# Patient Record
Sex: Female | Born: 1955 | Race: Black or African American | Hispanic: No | Smoking: Never smoker
Health system: Southern US, Community
[De-identification: ages and names within clinical notes are randomized; demographics above are authoritative.]

## PROBLEM LIST (undated history)

## (undated) DIAGNOSIS — Z973 Presence of spectacles and contact lenses: Secondary | ICD-10-CM

## (undated) DIAGNOSIS — I2510 Atherosclerotic heart disease of native coronary artery without angina pectoris: Secondary | ICD-10-CM

## (undated) DIAGNOSIS — M1990 Unspecified osteoarthritis, unspecified site: Secondary | ICD-10-CM

---

## 2005-05-13 HISTORY — PX: BREAST SURGERY: SHX581

## 2006-05-13 HISTORY — PX: ANKLE FRACTURE SURGERY: SHX122

## 2006-05-13 HISTORY — PX: KNEE ARTHROSCOPY: SHX127

## 2007-10-16 ENCOUNTER — Emergency Department (HOSPITAL_COMMUNITY): Admission: EM | Admit: 2007-10-16 | Discharge: 2007-10-16 | Payer: Self-pay | Admitting: Emergency Medicine

## 2008-05-17 ENCOUNTER — Ambulatory Visit: Payer: Self-pay | Admitting: Internal Medicine

## 2008-05-17 DIAGNOSIS — M7730 Calcaneal spur, unspecified foot: Secondary | ICD-10-CM | POA: Insufficient documentation

## 2008-05-17 DIAGNOSIS — E669 Obesity, unspecified: Secondary | ICD-10-CM | POA: Insufficient documentation

## 2008-05-19 ENCOUNTER — Ambulatory Visit: Payer: Self-pay | Admitting: Internal Medicine

## 2008-06-10 ENCOUNTER — Emergency Department (HOSPITAL_COMMUNITY): Admission: EM | Admit: 2008-06-10 | Discharge: 2008-06-10 | Payer: Self-pay | Admitting: Family Medicine

## 2008-06-10 ENCOUNTER — Telehealth (INDEPENDENT_AMBULATORY_CARE_PROVIDER_SITE_OTHER): Payer: Self-pay | Admitting: Internal Medicine

## 2008-06-15 ENCOUNTER — Encounter (INDEPENDENT_AMBULATORY_CARE_PROVIDER_SITE_OTHER): Payer: Self-pay | Admitting: Internal Medicine

## 2008-07-15 ENCOUNTER — Encounter (INDEPENDENT_AMBULATORY_CARE_PROVIDER_SITE_OTHER): Payer: Self-pay | Admitting: Internal Medicine

## 2008-07-15 ENCOUNTER — Ambulatory Visit: Payer: Self-pay | Admitting: Internal Medicine

## 2008-07-15 DIAGNOSIS — M1710 Unilateral primary osteoarthritis, unspecified knee: Secondary | ICD-10-CM

## 2008-07-15 DIAGNOSIS — IMO0002 Reserved for concepts with insufficient information to code with codable children: Secondary | ICD-10-CM | POA: Insufficient documentation

## 2008-07-15 LAB — CONVERTED CEMR LAB
Chlamydia, DNA Probe: NEGATIVE
Ketones, urine, test strip: NEGATIVE
Nitrite: NEGATIVE
Specific Gravity, Urine: >=1.03
Urobilinogen, UA: 0.2
WBC Urine, dipstick: NEGATIVE
pH: 5

## 2008-07-19 ENCOUNTER — Encounter (INDEPENDENT_AMBULATORY_CARE_PROVIDER_SITE_OTHER): Payer: Self-pay | Admitting: Internal Medicine

## 2008-07-19 ENCOUNTER — Ambulatory Visit (HOSPITAL_COMMUNITY): Admission: RE | Admit: 2008-07-19 | Discharge: 2008-07-19 | Payer: Self-pay | Admitting: Internal Medicine

## 2008-07-21 ENCOUNTER — Encounter (INDEPENDENT_AMBULATORY_CARE_PROVIDER_SITE_OTHER): Payer: Self-pay | Admitting: Internal Medicine

## 2008-07-22 ENCOUNTER — Encounter (INDEPENDENT_AMBULATORY_CARE_PROVIDER_SITE_OTHER): Payer: Self-pay | Admitting: *Deleted

## 2008-07-29 ENCOUNTER — Ambulatory Visit: Payer: Self-pay | Admitting: Internal Medicine

## 2008-07-29 LAB — CONVERTED CEMR LAB
ALT: 15 units/L (ref 0–35)
Alkaline Phosphatase: 90 units/L (ref 39–117)
Basophils Absolute: 0 10*3/uL (ref 0.0–0.1)
Basophils Relative: 0 % (ref 0–1)
CO2: 20 meq/L (ref 19–32)
Cholesterol: 192 mg/dL (ref 0–200)
Creatinine, Ser: 0.92 mg/dL (ref 0.40–1.20)
Eosinophils Absolute: 0.1 10*3/uL (ref 0.0–0.7)
Glucose, Bld: 84 mg/dL (ref 70–99)
MCHC: 32 g/dL (ref 30.0–36.0)
MCV: 93.8 fL (ref 78.0–100.0)
Monocytes Relative: 9 % (ref 3–12)
Neutrophils Relative %: 55 % (ref 43–77)
RBC: 4.06 M/uL (ref 3.87–5.11)
RDW: 13.8 % (ref 11.5–15.5)
Total Bilirubin: 0.5 mg/dL (ref 0.3–1.2)
Total CHOL/HDL Ratio: 2.3
Triglycerides: 50 mg/dL (ref <150)
VLDL: 10 mg/dL (ref 0–40)

## 2008-07-31 ENCOUNTER — Encounter (INDEPENDENT_AMBULATORY_CARE_PROVIDER_SITE_OTHER): Payer: Self-pay | Admitting: Internal Medicine

## 2008-09-23 ENCOUNTER — Ambulatory Visit: Payer: Self-pay | Admitting: Internal Medicine

## 2008-09-23 DIAGNOSIS — M719 Bursopathy, unspecified: Secondary | ICD-10-CM

## 2008-09-23 DIAGNOSIS — M67919 Unspecified disorder of synovium and tendon, unspecified shoulder: Secondary | ICD-10-CM | POA: Insufficient documentation

## 2008-09-26 ENCOUNTER — Ambulatory Visit (HOSPITAL_COMMUNITY): Admission: RE | Admit: 2008-09-26 | Discharge: 2008-09-26 | Payer: Self-pay | Admitting: Internal Medicine

## 2008-09-28 ENCOUNTER — Encounter (INDEPENDENT_AMBULATORY_CARE_PROVIDER_SITE_OTHER): Payer: Self-pay | Admitting: Internal Medicine

## 2009-03-20 ENCOUNTER — Encounter (INDEPENDENT_AMBULATORY_CARE_PROVIDER_SITE_OTHER): Payer: Self-pay | Admitting: Internal Medicine

## 2009-05-13 HISTORY — PX: REPLACEMENT TOTAL KNEE: SUR1224

## 2009-07-17 ENCOUNTER — Encounter: Admission: RE | Admit: 2009-07-17 | Discharge: 2009-10-10 | Payer: Self-pay | Admitting: Orthopaedic Surgery

## 2009-08-10 ENCOUNTER — Emergency Department (HOSPITAL_COMMUNITY): Admission: EM | Admit: 2009-08-10 | Discharge: 2009-08-10 | Payer: Self-pay | Admitting: Emergency Medicine

## 2009-08-22 ENCOUNTER — Encounter (INDEPENDENT_AMBULATORY_CARE_PROVIDER_SITE_OTHER): Payer: Self-pay | Admitting: *Deleted

## 2010-06-12 NOTE — Letter (Signed)
Summary: New Patient letter  [facility]  [address] [place], [place] [address]   Phone: [phone]  Fax: [phone]       08/22/2009 MRN: [id number]  Sara Mendez [address] [place], [place]  [address]  Dear Ms. Brees,  Welcome to the Gastroenterology Division at [facility].    You are scheduled to see Dr. Jarold Motto on 09/14/2009 at 10:00AM on the 3rd floor at [facility], [address]  We ask that you try to arrive at our office 15 minutes prior to your appointment time to allow for check-in.  We would like you to complete the enclosed self-administered evaluation form prior to your visit and bring it with you on the day of your appointment.  We will review it with you.  Also, please bring a complete list of all your medications or, if you prefer, bring the medication bottles and we will list them.  Please bring your insurance card so that we may make a copy of it.  If your insurance requires a referral to see a specialist, please bring your referral form from your primary care physician.  Co-payments are due at the time of your visit and may be paid by cash, check or credit card.     Your office visit will consist of a consult with your physician (includes a physical exam), any laboratory testing he/she may order, scheduling of any necessary diagnostic testing (e.g. x-ray, ultrasound, CT-scan), and scheduling of a procedure (e.g. Endoscopy, Colonoscopy) if required.  Please allow enough time on your schedule to allow for any/all of these possibilities.    If you cannot keep your appointment, please call [phone] to cancel or reschedule prior to your appointment date.  This allows [place] the opportunity to schedule an appointment for another patient in need of care.  If you do not cancel or reschedule by 5 p.m. the business day prior to your appointment date, you will be charged a $50.00 late cancellation/no-show fee.    Thank you for  choosing [facility] for your medical needs.  We appreciate the opportunity to care for you.  Please visit [place] at our website  to learn more about our practice.                     Sincerely,                                                             The Gastroenterology Division

## 2010-10-26 ENCOUNTER — Emergency Department (HOSPITAL_COMMUNITY): Payer: Self-pay

## 2010-10-26 ENCOUNTER — Emergency Department (HOSPITAL_COMMUNITY)
Admission: EM | Admit: 2010-10-26 | Discharge: 2010-10-26 | Disposition: A | Discharge status: Home / Self Care | Payer: No Typology Code available for payment source | Attending: Emergency Medicine | Admitting: Emergency Medicine

## 2010-10-26 DIAGNOSIS — S8000XA Contusion of unspecified knee, initial encounter: Secondary | ICD-10-CM | POA: Insufficient documentation

## 2010-10-26 DIAGNOSIS — W010XXA Fall on same level from slipping, tripping and stumbling without subsequent striking against object, initial encounter: Secondary | ICD-10-CM | POA: Insufficient documentation

## 2010-10-26 DIAGNOSIS — M79609 Pain in unspecified limb: Secondary | ICD-10-CM | POA: Insufficient documentation

## 2010-10-26 DIAGNOSIS — IMO0002 Reserved for concepts with insufficient information to code with codable children: Secondary | ICD-10-CM | POA: Insufficient documentation

## 2010-10-26 DIAGNOSIS — M25569 Pain in unspecified knee: Secondary | ICD-10-CM | POA: Insufficient documentation

## 2010-10-26 DIAGNOSIS — S6390XA Sprain of unspecified part of unspecified wrist and hand, initial encounter: Secondary | ICD-10-CM | POA: Insufficient documentation

## 2012-02-11 HISTORY — PX: GASTRIC BYPASS: SHX52

## 2012-07-19 ENCOUNTER — Encounter (HOSPITAL_BASED_OUTPATIENT_CLINIC_OR_DEPARTMENT_OTHER): Payer: Self-pay | Admitting: *Deleted

## 2012-07-19 ENCOUNTER — Emergency Department (HOSPITAL_BASED_OUTPATIENT_CLINIC_OR_DEPARTMENT_OTHER): Payer: 59

## 2012-07-19 ENCOUNTER — Emergency Department (HOSPITAL_BASED_OUTPATIENT_CLINIC_OR_DEPARTMENT_OTHER)
Admission: EM | Admit: 2012-07-19 | Discharge: 2012-07-20 | Disposition: A | Discharge status: Home / Self Care | Payer: 59 | Attending: Emergency Medicine | Admitting: Emergency Medicine

## 2012-07-19 DIAGNOSIS — J069 Acute upper respiratory infection, unspecified: Secondary | ICD-10-CM

## 2012-07-19 DIAGNOSIS — J029 Acute pharyngitis, unspecified: Secondary | ICD-10-CM | POA: Insufficient documentation

## 2012-07-19 NOTE — ED Notes (Signed)
Prod cough with yellow sputum onset Friday. Runny nose. "Throat feels raw" "H/A from coughing"

## 2012-07-19 NOTE — ED Provider Notes (Signed)
History     CSN: [id number]  Arrival date & time 07/19/12  2133   First MD Initiated Contact with Patient 07/19/12 2252      Chief Complaint  Patient presents with  . Cough    (Consider location/radiation/quality/duration/timing/severity/associated sxs/prior treatment) HPI Patient presents emergency department with cough for the last 2 days.  Patient, states she did not take anything prior to arrival for her symptoms.  Patient, states she has sore throat from her coughing.  Patient denies chest pain, shortness breath, weakness, nausea, vomiting, fever,  blurred vision, syncope, or dysuria.  Patient, states, that nothing seems to make her condition worse other than coughing.  Patient, states the cough is worse at night, when she lays down for bed. History reviewed. No pertinent past medical history.  Past Surgical History  Procedure Laterality Date  . Replacement total knee    . Gastric bypass      History reviewed. No pertinent family history.  History  Substance Use Topics  . Smoking status: Never Smoker   . Smokeless tobacco: Not on file  . Alcohol Use: No    OB History   Grav Para Term Preterm Abortions TAB SAB Ect Mult Living                  Review of Systems All other systems negative except as documented in the HPI. All pertinent positives and negatives as reviewed in the HPI. Allergies  Penicillins  Home Medications  No current outpatient prescriptions on file.  BP 149/78  Pulse 76  Temp(Src) 98.5 F (36.9 C)  Resp 20  Ht 5\' 5"  (1.651 m)  Wt 185 lb (83.915 kg)  BMI 30.79 kg/m2  SpO2 100%  Physical Exam  Nursing note and vitals reviewed. Constitutional: She is oriented to person, place, and time. She appears well-developed and well-nourished.  HENT:  Head: Normocephalic and atraumatic.  Mouth/Throat: Oropharynx is clear and moist. No oropharyngeal exudate.  Eyes: Pupils are equal, round, and reactive to light.  Neck: Normal range of motion. Neck  supple.  Cardiovascular: Normal rate, regular rhythm and normal heart sounds.  Exam reveals no gallop and no friction rub.   No murmur heard. Pulmonary/Chest: Effort normal and breath sounds normal. No respiratory distress. She has no wheezes. She has no rales.  Neurological: She is alert and oriented to person, place, and time.  Skin: Skin is warm and dry.    ED Course  Procedures (including critical care time)  Patient most likely has a viral URI, based on her HPI  and physical exam findings. Patient has been stable. Advised to follow up with her PCP.   MDM          Carlyle Dolly, PA-C 07/22/12 0631  Medical screening examination/treatment/procedure(s) were performed by non-physician practitioner and as supervising physician I was immediately available for consultation/collaboration.  Derwood Kaplan, MD 08/04/12 [phone]

## 2012-07-20 MED ORDER — GUAIFENESIN ER 1200 MG PO TB12: 1.0000 | ORAL_TABLET | Freq: Two times a day (BID) | ORAL | Status: DC

## 2012-07-20 MED ORDER — PROMETHAZINE-DM 6.25-15 MG/5ML PO SYRP: 5.0000 mL | ORAL_SOLUTION | Freq: Four times a day (QID) | ORAL | Status: DC | PRN

## 2012-07-20 MED ORDER — ACETAMINOPHEN-CODEINE 120-12 MG/5ML PO SOLN
ORAL | Status: AC
  Administered 2012-07-20: 10 mL
  Filled 2012-07-20: qty 10

## 2012-07-20 MED ORDER — PREDNISONE 50 MG PO TABS: 50.0000 mg | ORAL_TABLET | Freq: Every day | ORAL | Status: DC

## 2012-07-20 NOTE — ED Notes (Signed)
Medicated per order tylenol w/codiene

## 2012-08-18 ENCOUNTER — Encounter (HOSPITAL_BASED_OUTPATIENT_CLINIC_OR_DEPARTMENT_OTHER): Payer: Self-pay | Admitting: *Deleted

## 2012-08-18 NOTE — Progress Notes (Signed)
Pt denies any heart or resp problems-had gastric bypass-lost 100 lb-to bring overnight bag

## 2012-08-20 NOTE — H&P (Signed)
  This is a 57 y/o b female who presents with a tumor of the right mandible.  She was seen by her DDS and an endodontist who ruled out dental pathology.  When she was referred in there was significant buccal expansion and thinning of the inferior boarder.  A biopsy was taken from the area.  The diagnosis was an odontogenic keratocyst.  Excision with an ostectomy and Carnoy's solution was recommended.

## 2012-08-20 NOTE — H&P (Signed)
Sara Mendez is an 57 y.o. female.   Chief Complaint: Odontogenic Keratocyst  HPI: unknown duration   Past Medical History  Diagnosis Date  . DJD (degenerative joint disease)   . Wears glasses     Past Surgical History  Procedure Laterality Date  . Replacement total knee  2011    right and left total knees in [place]  . Gastric bypass  10/13    has lost 100 lb  . Knee arthroscopy  2008    left  . Ankle fracture surgery  2008    lt  . Breast surgery  2007    lt br bx    History reviewed. No pertinent family history. Social History:  reports that she has never smoked. She does not have any smokeless tobacco history on file. She reports that she does not drink alcohol or use illicit drugs.  Allergies:  Allergies  Allergen Reactions  . Penicillins     REACTION: rash    No prescriptions prior to admission    No results found for this or any previous visit (from the past 48 hour(s)). No results found.  ROS  Height 5\' 6"  (1.676 m), weight 90.266 kg (199 lb). Physical Exam  HENT:  Head:    Mouth/Throat:       Assessment/Plan Surgical excision  Peripheral ostectomy, chemical debridement packing of area  Burney Calzadilla,JOSEPH L 08/20/2012, 7:07 PM

## 2012-08-24 ENCOUNTER — Ambulatory Visit (HOSPITAL_BASED_OUTPATIENT_CLINIC_OR_DEPARTMENT_OTHER): Payer: 59 | Admitting: Anesthesiology

## 2012-08-24 ENCOUNTER — Encounter (HOSPITAL_BASED_OUTPATIENT_CLINIC_OR_DEPARTMENT_OTHER): Payer: Self-pay | Admitting: *Deleted

## 2012-08-24 ENCOUNTER — Encounter (HOSPITAL_BASED_OUTPATIENT_CLINIC_OR_DEPARTMENT_OTHER)
Admission: RE | Disposition: A | Discharge status: Home / Self Care | Payer: Self-pay | Source: Ambulatory Visit | Attending: Oral Surgery

## 2012-08-24 ENCOUNTER — Encounter (HOSPITAL_BASED_OUTPATIENT_CLINIC_OR_DEPARTMENT_OTHER): Payer: Self-pay | Admitting: Anesthesiology

## 2012-08-24 ENCOUNTER — Ambulatory Visit (HOSPITAL_BASED_OUTPATIENT_CLINIC_OR_DEPARTMENT_OTHER)
Admission: RE | Admit: 2012-08-24 | Discharge: 2012-08-25 | Disposition: A | Discharge status: Home / Self Care | Payer: 59 | Source: Ambulatory Visit | Attending: Oral Surgery | Admitting: Oral Surgery

## 2012-08-24 DIAGNOSIS — D165 Benign neoplasm of lower jaw bone: Secondary | ICD-10-CM

## 2012-08-24 DIAGNOSIS — D492 Neoplasm of unspecified behavior of bone, soft tissue, and skin: Secondary | ICD-10-CM | POA: Insufficient documentation

## 2012-08-24 HISTORY — DX: Unspecified osteoarthritis, unspecified site: M19.90

## 2012-08-24 HISTORY — DX: Presence of spectacles and contact lenses: Z97.3

## 2012-08-24 HISTORY — PX: TOOTH EXTRACTION: SHX859

## 2012-08-24 SURGERY — DENTAL RESTORATION/EXTRACTIONS
Anesthesia: General | Site: Mouth | Laterality: Right | Wound class: Clean Contaminated

## 2012-08-24 MED ORDER — MIDAZOLAM HCL 5 MG/5ML IJ SOLN
INTRAMUSCULAR | Status: DC | PRN
  Administered 2012-08-24: 2 mg via INTRAVENOUS

## 2012-08-24 MED ORDER — MIDAZOLAM HCL 2 MG/2ML IJ SOLN: 1.0000 mg | INTRAMUSCULAR | Status: DC | PRN

## 2012-08-24 MED ORDER — LACTATED RINGERS IV SOLN
INTRAVENOUS | Status: DC
  Administered 2012-08-24: 14:00:00 via INTRAVENOUS

## 2012-08-24 MED ORDER — PROMETHAZINE HCL 25 MG/ML IJ SOLN: 6.2500 mg | INTRAMUSCULAR | Status: DC | PRN

## 2012-08-24 MED ORDER — PROPOFOL 10 MG/ML IV BOLUS
INTRAVENOUS | Status: DC | PRN
  Administered 2012-08-24: 200 mg via INTRAVENOUS

## 2012-08-24 MED ORDER — HYDROCODONE-ACETAMINOPHEN 7.5-325 MG/15ML PO SOLN
15.0000 mL | ORAL | Status: DC | PRN
  Administered 2012-08-24 – 2012-08-25 (×3): 15 mL via ORAL

## 2012-08-24 MED ORDER — CEPHALEXIN 500 MG PO CAPS
500.0000 mg | ORAL_CAPSULE | Freq: Four times a day (QID) | ORAL | Status: DC
  Administered 2012-08-24 – 2012-08-25 (×3): 500 mg via ORAL

## 2012-08-24 MED ORDER — OXYCODONE HCL 5 MG PO TABS: 5.0000 mg | ORAL_TABLET | Freq: Once | ORAL | Status: DC | PRN

## 2012-08-24 MED ORDER — PROMETHAZINE HCL 25 MG/ML IJ SOLN: 25.0000 mg | Freq: Four times a day (QID) | INTRAMUSCULAR | Status: DC | PRN

## 2012-08-24 MED ORDER — METHYLENE BLUE 1 % INJ SOLN
INTRAMUSCULAR | Status: DC | PRN
  Administered 2012-08-24: 3 mL

## 2012-08-24 MED ORDER — CEFAZOLIN SODIUM-DEXTROSE 2-3 GM-% IV SOLR
2.0000 g | INTRAVENOUS | Status: AC
  Administered 2012-08-24: 2 g via INTRAVENOUS

## 2012-08-24 MED ORDER — DEXAMETHASONE SODIUM PHOSPHATE 4 MG/ML IJ SOLN
INTRAMUSCULAR | Status: DC | PRN
  Administered 2012-08-24: 10 mg via INTRAVENOUS

## 2012-08-24 MED ORDER — LACTATED RINGERS IV SOLN
INTRAVENOUS | Status: DC
  Administered 2012-08-24 (×2): via INTRAVENOUS

## 2012-08-24 MED ORDER — NONFORMULARY OR COMPOUNDED ITEM
Freq: Once | Status: AC
  Administered 2012-08-24: 12:00:00 via TOPICAL

## 2012-08-24 MED ORDER — HYDROCODONE-ACETAMINOPHEN 7.5-325 MG PO TABS: 1.0000 | ORAL_TABLET | Freq: Four times a day (QID) | ORAL | Status: DC | PRN

## 2012-08-24 MED ORDER — FENTANYL CITRATE 0.05 MG/ML IJ SOLN
INTRAMUSCULAR | Status: DC | PRN
  Administered 2012-08-24 (×2): 25 ug via INTRAVENOUS
  Administered 2012-08-24: 100 ug via INTRAVENOUS

## 2012-08-24 MED ORDER — HYDROCODONE-IBUPROFEN 5-200 MG PO TABS: 1.0000 | ORAL_TABLET | Freq: Three times a day (TID) | ORAL | Status: DC | PRN

## 2012-08-24 MED ORDER — BACITRACIN-NEOMYCIN-POLYMYXIN 400-5-5000 EX OINT
TOPICAL_OINTMENT | CUTANEOUS | Status: DC | PRN
  Administered 2012-08-24: 1 via TOPICAL

## 2012-08-24 MED ORDER — CEPHALEXIN 500 MG PO CAPS: 500.0000 mg | ORAL_CAPSULE | Freq: Four times a day (QID) | ORAL | Status: DC

## 2012-08-24 MED ORDER — ACETAMINOPHEN-CODEINE 120-12 MG/5ML PO SOLN: 5.0000 mL | Freq: Four times a day (QID) | ORAL | Status: DC | PRN

## 2012-08-24 MED ORDER — MORPHINE SULFATE 2 MG/ML IJ SOLN
1.0000 mg | INTRAMUSCULAR | Status: DC | PRN
  Administered 2012-08-24 – 2012-08-25 (×4): 1 mg via INTRAVENOUS

## 2012-08-24 MED ORDER — ONDANSETRON HCL 4 MG/2ML IJ SOLN
INTRAMUSCULAR | Status: DC | PRN
  Administered 2012-08-24: 4 mg via INTRAVENOUS

## 2012-08-24 MED ORDER — HYDROCODONE-ACETAMINOPHEN 7.5-325 MG/15ML PO SOLN
15.0000 mL | Freq: Four times a day (QID) | ORAL | Status: DC | PRN
  Administered 2012-08-24: 15 mL via ORAL

## 2012-08-24 MED ORDER — FENTANYL CITRATE 0.05 MG/ML IJ SOLN: 50.0000 ug | INTRAMUSCULAR | Status: DC | PRN

## 2012-08-24 MED ORDER — 0.9 % SODIUM CHLORIDE (POUR BTL) OPTIME
TOPICAL | Status: DC | PRN
  Administered 2012-08-24: 500 mL

## 2012-08-24 MED ORDER — SUCCINYLCHOLINE CHLORIDE 20 MG/ML IJ SOLN
INTRAMUSCULAR | Status: DC | PRN
  Administered 2012-08-24: 120 mg via INTRAVENOUS

## 2012-08-24 MED ORDER — OXYCODONE HCL 5 MG/5ML PO SOLN: 5.0000 mg | Freq: Once | ORAL | Status: DC | PRN

## 2012-08-24 MED ORDER — LIDOCAINE-EPINEPHRINE 2 %-1:100000 IJ SOLN
INTRAMUSCULAR | Status: DC | PRN
  Administered 2012-08-24: 1.7 mL

## 2012-08-24 MED ORDER — LIDOCAINE HCL (CARDIAC) 20 MG/ML IV SOLN
INTRAVENOUS | Status: DC | PRN
  Administered 2012-08-24: 100 mg via INTRAVENOUS

## 2012-08-24 MED ORDER — HYDROMORPHONE HCL PF 1 MG/ML IJ SOLN
0.2500 mg | INTRAMUSCULAR | Status: DC | PRN
  Administered 2012-08-24 (×3): 0.5 mg via INTRAVENOUS

## 2012-08-24 SURGICAL SUPPLY — 44 items
BLADE SURG 15 STRL LF DISP TIS (BLADE) ×1 IMPLANT
BLADE SURG 15 STRL SS (BLADE) ×1
BUR FAST CUTTING MED (BURR) ×2 IMPLANT
BUR PEAR (BURR) ×2 IMPLANT
CANISTER SUCTION 1200CC (MISCELLANEOUS) ×2 IMPLANT
CATH ROBINSON RED A/P 10FR (CATHETERS) IMPLANT
CLOTH BEACON ORANGE TIMEOUT ST (SAFETY) ×2 IMPLANT
COVER MAYO STAND STRL (DRAPES) ×2 IMPLANT
COVER TABLE BACK 60X90 (DRAPES) ×2 IMPLANT
DRAPE U-SHAPE 76X120 STRL (DRAPES) ×2 IMPLANT
GAUZE PACKING IODOFORM 1/4X5 (PACKING) IMPLANT
GLOVE BIO SURGEON STRL SZ 6.5 (GLOVE) ×4 IMPLANT
GLOVE BIO SURGEON STRL SZ7.5 (GLOVE) ×2 IMPLANT
GLOVE BIOGEL M STRL SZ7.5 (GLOVE) ×2 IMPLANT
GLOVE BIOGEL PI IND STRL 8 (GLOVE) ×1 IMPLANT
GLOVE BIOGEL PI INDICATOR 8 (GLOVE) ×1
GOWN BRE IMP PREV XXLGXLNG (GOWN DISPOSABLE) ×2 IMPLANT
GOWN PREVENTION PLUS XLARGE (GOWN DISPOSABLE) ×4 IMPLANT
IV NS 500ML (IV SOLUTION) ×1
IV NS 500ML BAXH (IV SOLUTION) ×1 IMPLANT
NDL SAFETY ECLIPSE 18X1.5 (NEEDLE) ×1 IMPLANT
NEEDLE DENTAL 27 LONG (NEEDLE) ×2 IMPLANT
NEEDLE HYPO 18GX1.5 SHARP (NEEDLE) ×1
NEEDLE HYPO 22GX1.5 SAFETY (NEEDLE) ×2 IMPLANT
NS IRRIG 1000ML POUR BTL (IV SOLUTION) ×2 IMPLANT
PACK BASIN DAY SURGERY FS (CUSTOM PROCEDURE TRAY) ×2 IMPLANT
RUBBERBAND STERILE (MISCELLANEOUS) IMPLANT
SPONGE SURGIFOAM ABS GEL 12-7 (HEMOSTASIS) IMPLANT
STRIP CLOSURE SKIN 1/2X4 (GAUZE/BANDAGES/DRESSINGS) IMPLANT
SUT CHROMIC 3 0 PS 2 (SUTURE) IMPLANT
SUT CHROMIC 4 0 P 3 18 (SUTURE) IMPLANT
SUT SILK 3 0 PS 1 (SUTURE) IMPLANT
SUT VIC AB 5-0 PS2 18 (SUTURE) ×2 IMPLANT
SUT VICRYL 4-0 PS2 18IN ABS (SUTURE) ×2 IMPLANT
SYR 50ML LL SCALE MARK (SYRINGE) ×4 IMPLANT
SYRINGE 10CC LL (SYRINGE) ×2 IMPLANT
TOOTHBRUSH ADULT (PERSONAL CARE ITEMS) ×2 IMPLANT
TOWEL OR 17X24 6PK STRL BLUE (TOWEL DISPOSABLE) ×4 IMPLANT
TOWEL OR NON WOVEN STRL DISP B (DISPOSABLE) ×2 IMPLANT
TRAY DSU PREP LF (CUSTOM PROCEDURE TRAY) ×2 IMPLANT
TUBE CONNECTING 20X1/4 (TUBING) ×2 IMPLANT
VENT IRR SPI W TUB AD (MISCELLANEOUS) ×2 IMPLANT
WATER STERILE IRR 1000ML POUR (IV SOLUTION) ×2 IMPLANT
YANKAUER SUCT BULB TIP NO VENT (SUCTIONS) ×2 IMPLANT

## 2012-08-24 NOTE — Anesthesia Procedure Notes (Signed)
Procedure Name: Intubation Date/Time: 08/24/2012 10:41 AM Performed by: Verlan Friends Pre-anesthesia Checklist: Patient identified, Emergency Drugs available, Suction available, Patient being monitored and Timeout performed Patient Re-evaluated:Patient Re-evaluated prior to inductionOxygen Delivery Method: Circle System Utilized Preoxygenation: Pre-oxygenation with 100% oxygen Intubation Type: IV induction Ventilation: Mask ventilation without difficulty Grade View: Grade I Nasal Tubes: Nasal Rae, Nasal prep performed and Right Tube size: 7.5 mm Number of attempts: 1 Airway Equipment and Method: Video-laryngoscopy Placement Confirmation: ETT inserted through vocal cords under direct vision,  positive ETCO2 and breath sounds checked- equal and bilateral Tube secured with: Tape Dental Injury: Teeth and Oropharynx as per pre-operative assessment  Comments: Glidescope used, red rubber tube introduced nasal rae tube. Atraumatic.  Magills not needed tube advanced without difficulty by Dr. Durene Romans.

## 2012-08-24 NOTE — Brief Op Note (Signed)
08/24/2012  12:00 PM  PATIENT:  Sara Mendez  57 y.o. female  PRE-OPERATIVE DIAGNOSIS:  KERATOCYST  POST-OPERATIVE DIAGNOSIS:  odontogenic cyst  PROCEDURE:  Procedure(s): SURGICAL REMOVAL OF ODONTOGENIC KERATOCYST AND EXTRACT TEETH NUMBERS 30, AND 31 (Right)  SURGEON:  Surgeon(s) and Role:    * Hinton Dyer, DDS - Primary  PHYSICIAN ASSISTANT:none  ASSISTANTS: Hadassah Pais   ANESTHESIA:   general  EBL:  Total I/O In: 1000 [I.V.:1000] Out: -   BLOOD ADMINISTERED:none  DRAINS: none   LOCAL MEDICATIONS USED:  XYLOCAINE   SPECIMEN:  Source of Specimen:  Right mandible  DISPOSITION OF SPECIMEN:  PATHOLOGY  COUNTS:  YES  TOURNIQUET:  * No tourniquets in log *  DICTATION: .Dragon Dictation  PLAN OF CARE: Admit for overnight observation  PATIENT DISPOSITION:  PACU - hemodynamically stable.   Delay start of Pharmacological VTE agent (>24hrs) due to surgical blood loss or risk of bleeding: no

## 2012-08-24 NOTE — Anesthesia Preprocedure Evaluation (Signed)
Anesthesia Evaluation  Patient identified by MRN, date of birth, ID band Patient awake    Reviewed: Allergy & Precautions, H&P , NPO status , Patient's Chart, lab work & pertinent test results  Airway Mallampati: II TM Distance: >3 FB Neck ROM: Full    Dental   Pulmonary  breath sounds clear to auscultation        Cardiovascular Rhythm:Regular Rate:Normal     Neuro/Psych    GI/Hepatic S/p gastric bypass   Endo/Other    Renal/GU      Musculoskeletal   Abdominal (+) + obese,   Peds  Hematology   Anesthesia Other Findings   Reproductive/Obstetrics                           Anesthesia Physical Anesthesia Plan  ASA: II  Anesthesia Plan: General   Post-op Pain Management:    Induction: Intravenous  Airway Management Planned: Nasal ETT  Additional Equipment:   Intra-op Plan:   Post-operative Plan: Extubation in OR  Informed Consent: I have reviewed the patients History and Physical, chart, labs and discussed the procedure including the risks, benefits and alternatives for the proposed anesthesia with the patient or authorized representative who has indicated his/her understanding and acceptance.     Plan Discussed with: CRNA and Surgeon  Anesthesia Plan Comments:         Anesthesia Quick Evaluation

## 2012-08-24 NOTE — Anesthesia Postprocedure Evaluation (Signed)
  Anesthesia Post-op Note  Patient: Sara Mendez  Procedure(s) Performed: Procedure(s): SURGICAL REMOVAL OF ODONTOGENIC KERATOCYST AND EXTRACT TEETH NUMBERS 30, AND 31 (Right)  Patient Location: PACU  Anesthesia Type:General  Level of Consciousness: awake  Airway and Oxygen Therapy: Patient Spontanous Breathing  Post-op Pain: mild  Post-op Assessment: Post-op Vital signs reviewed, Patient's Cardiovascular Status Stable, Respiratory Function Stable, Patent Airway, No signs of Nausea or vomiting and Pain level controlled  Post-op Vital Signs: stable  Complications: No apparent anesthesia complications

## 2012-08-24 NOTE — Progress Notes (Signed)
Afebrile, vss,   No bleeding noted...doing well. Keep ice on affected area.  Plan for d/c in am.

## 2012-08-24 NOTE — Transfer of Care (Signed)
Immediate Anesthesia Transfer of Care Note  Patient: Sara Mendez  Procedure(s) Performed: Procedure(s): SURGICAL REMOVAL OF ODONTOGENIC KERATOCYST AND EXTRACT TEETH NUMBERS 30, AND 31 (Right)  Patient Location: PACU  Anesthesia Type:General  Level of Consciousness: awake, alert , oriented and patient cooperative  Airway & Oxygen Therapy: Patient Spontanous Breathing and Patient connected to face mask oxygen  Post-op Assessment: Report given to PACU RN and Post -op Vital signs reviewed and stable  Post vital signs: Reviewed and stable  Complications: No apparent anesthesia complications

## 2012-08-24 NOTE — H&P (Signed)
No change in medical history.  

## 2012-08-24 NOTE — Op Note (Signed)
Patient was brought to the operating room in supine position and which remained throughout the whole procedure. She was intubated via right nasoendotracheal tube. She was prepped and draped in usual fashion for intraoral procedure. The throat was suctioned out and packed off with a moist open 4 x 4 gauze. 2 carpules of 2% Xylocaine with 1-100,000 epinephrine was given as a block and infiltration around the lower right mandible. #15 blade made an incision over the retromolar pad approximately 2 cm in length and around the necks of teeth #30 and 31 to #27. Buccal flap was elevated. Immediately the large defect was encountered with significant buccal expansion. Both molars were mobile. #31 the crown easily come off and the roots were sectioned with a round bur and copious irrigation. Each root was then removed with a rongeur. #30 was removed with a #23 forceps. The tumor could be seen within the socket. The area was opened up to the alveolar crest and the thin buccal wall. Using a large curet the lesion was removed in multiple pieces. It was found to be attached to the nerve and the nerve was partially severed. It was dissected free of the nerve on both sides. Once the whole tumor was removed the area was copiously irrigated and then covered with methylene blue. A large alveolar bur was then used to remove all of the methylene blue from the bone. This was a peripheral ostectomy and done with a large alveolar bur. Again the area was copiously irrigated and then packed with Carnoy's solution. It was held in place for 5 minutes and irrigated out. 5-0 Vicryl sutures were used to reattach the nerve. Quarter-inch iodoform gauze covered in Neosporin was then packed into the defect. Good hemostasis was achieved. The soft tissue was then closed primarily with multiple 4-0 chromic sutures. The area was packed off. The throat pack was removed. There was no evidence of fracture and the mandible appeared solid at this point. She  was then extubated on the table in good condition. She'll be kept overnight for observation and discharged in the morning

## 2012-08-25 ENCOUNTER — Encounter (HOSPITAL_BASED_OUTPATIENT_CLINIC_OR_DEPARTMENT_OTHER): Payer: Self-pay | Admitting: Oral Surgery

## 2012-08-25 NOTE — Addendum Note (Signed)
Addendum created 08/25/12 1012 by Lance Coon, CRNA   Modules edited: Charges VN

## 2013-03-18 ENCOUNTER — Other Ambulatory Visit: Payer: Self-pay

## 2013-07-12 ENCOUNTER — Ambulatory Visit
Admission: RE | Admit: 2013-07-12 | Discharge: 2013-07-12 | Disposition: A | Discharge status: Home / Self Care | Payer: Worker's Compensation | Source: Ambulatory Visit | Attending: Family | Admitting: Family

## 2013-07-12 ENCOUNTER — Other Ambulatory Visit: Payer: Self-pay | Admitting: Family

## 2013-07-12 DIAGNOSIS — W19XXXA Unspecified fall, initial encounter: Secondary | ICD-10-CM

## 2013-07-12 DIAGNOSIS — R52 Pain, unspecified: Secondary | ICD-10-CM

## 2014-06-06 ENCOUNTER — Encounter (HOSPITAL_COMMUNITY): Payer: Self-pay | Admitting: Emergency Medicine

## 2014-06-06 ENCOUNTER — Emergency Department (HOSPITAL_COMMUNITY): Payer: Worker's Compensation

## 2014-06-06 ENCOUNTER — Emergency Department (HOSPITAL_COMMUNITY)
Admission: EM | Admit: 2014-06-06 | Discharge: 2014-06-06 | Disposition: A | Discharge status: Home / Self Care | Payer: Worker's Compensation | Attending: Emergency Medicine | Admitting: Emergency Medicine

## 2014-06-06 DIAGNOSIS — Z8739 Personal history of other diseases of the musculoskeletal system and connective tissue: Secondary | ICD-10-CM | POA: Insufficient documentation

## 2014-06-06 DIAGNOSIS — Y9289 Other specified places as the place of occurrence of the external cause: Secondary | ICD-10-CM | POA: Diagnosis not present

## 2014-06-06 DIAGNOSIS — Z79899 Other long term (current) drug therapy: Secondary | ICD-10-CM | POA: Insufficient documentation

## 2014-06-06 DIAGNOSIS — Y998 Other external cause status: Secondary | ICD-10-CM | POA: Diagnosis not present

## 2014-06-06 DIAGNOSIS — Z880 Allergy status to penicillin: Secondary | ICD-10-CM | POA: Diagnosis not present

## 2014-06-06 DIAGNOSIS — W01198A Fall on same level from slipping, tripping and stumbling with subsequent striking against other object, initial encounter: Secondary | ICD-10-CM | POA: Diagnosis not present

## 2014-06-06 DIAGNOSIS — S4992XA Unspecified injury of left shoulder and upper arm, initial encounter: Secondary | ICD-10-CM | POA: Diagnosis present

## 2014-06-06 DIAGNOSIS — Y9389 Activity, other specified: Secondary | ICD-10-CM | POA: Diagnosis not present

## 2014-06-06 DIAGNOSIS — W19XXXA Unspecified fall, initial encounter: Secondary | ICD-10-CM

## 2014-06-06 DIAGNOSIS — Z792 Long term (current) use of antibiotics: Secondary | ICD-10-CM | POA: Insufficient documentation

## 2014-06-06 DIAGNOSIS — S46912A Strain of unspecified muscle, fascia and tendon at shoulder and upper arm level, left arm, initial encounter: Secondary | ICD-10-CM | POA: Diagnosis not present

## 2014-06-06 MED ORDER — HYDROCODONE-IBUPROFEN 5-200 MG PO TABS: 1.0000 | ORAL_TABLET | Freq: Three times a day (TID) | ORAL | Status: DC | PRN

## 2014-06-06 MED ORDER — HYDROCODONE-ACETAMINOPHEN 5-325 MG PO TABS
1.0000 | ORAL_TABLET | Freq: Once | ORAL | Status: AC
  Administered 2014-06-06: 1 via ORAL
  Filled 2014-06-06: qty 1

## 2014-06-06 NOTE — ED Notes (Signed)
Pt fell on ice and has pain to LUE unable to have ROM, good strong pulses, hit forehead denies any pain or loc, alert x4. Pt holding LT arm to postion of comfort unable to lift or move. Denies an shoulder or clavicle pain. No visible deformity.

## 2014-06-06 NOTE — ED Notes (Signed)
Patient transported to X-ray 

## 2014-06-06 NOTE — Discharge Instructions (Signed)
Strain A strain is an injury to a muscle or the tissue that connects muscles to bones (tendon). In a strain injury, the muscle or tendon is either stretched or torn. Muscles are more susceptible to strains if they cross two joints, such as:  Hamstrings.  Quadriceps.  Calves.  Biceps. There are three categories of strains:  A first-degree strain is a small tear in the muscle. There is no lengthening of the muscle, but pain may be present with contraction of the muscle.  A second-degree strain is a small tear in the muscle accompanied by lengthening of the muscle. Muscles with a second-degree strain are still able to function.  A third-degree strain is a complete tear of the muscle. Muscles with a third-degree strain cannot function properly. Strains often have bleeding and bruising within the muscle. SYMPTOMS   Pain, tenderness, redness or bruising, and swelling in the area of injury.  Loss of normal mobility of the injured joint. CAUSES  A sudden force exerted on a muscle or tendon that it cannot withstand usually causes strains. This may be due to a sudden overload of a contracted muscle, overuse, or sudden increase or change in activity.  RISK INCREASES WITH:  Trauma.  Poor strength and flexibility.  Failure to warm-up properly before activity.  Return to activity before healing is complete. PREVENTION  Warm-up and stretch properly before and activity.  Maintain physical fitness:  Joint flexibility.  Muscle strength.  Endurance and conditioning.  Strengthen weak muscles with exercises to prevent recurrence. PROGNOSIS  If treated properly, strains are usually curable. The time it takes to recover is related to the severity of the injury and usually varies from 2 to 8 weeks. RELATED COMPLICATIONS   Re-injury or recurrence of symptoms, permanent weakness.  Joint stiffness if the strain is severe and rehabilitation is incomplete.  Delayed healing or resolution of  symptoms if sports are resumed before rehabilitation is complete.  Excessive bleeding into muscle, especially if taking anti-inflammatory medicines. This can lead to delayed recovery and injury to nerves, muscle, and blood vessels; this is an emergency. TREATMENT  Treatment initially involves ice and medicine to help reduce pain and inflammation. Use of the affected muscle should be limited by a:  Brace.  Elastic bandage wrapping.  Splint.  Cast.  Sling. Strengthening and stretching exercises may be necessary after immobilization to prevent joint stiffness. These exercises may be completed at home or with a therapist. If the tendon is torn, then surgery may be necessary to repair it.  MEDICATION   Avoid aspirin or ibuprofen in the first 48 hours after the injury. These medicines may increase the tendency to bleed. During this time, you may take pain relievers, such as acetaminophen, that do not affect bleeding.  After the first 48 hours, if pain medicine is necessary, then nonsteroidal anti-inflammatory medicines, such as aspirin and ibuprofen, or other minor pain relievers, such as acetaminophen, are often recommended.  Do not take pain medicine within 7 days before surgery.  Prescription pain relievers may be prescribed. Use only as directed and only as much as you need  Ointments applied to the skin may be helpful. HEAT AND COLD  Cold treatment (icing) relieves pain and reduces inflammation. Cold treatment should be applied for 10 to 15 minutes every 2 to 3 hours for inflammation and pain and immediately after any activity that aggravates your symptoms. Use ice packs or massage the area with a piece of ice (ice massage).  Heat treatment may be  used prior to performing the stretching and strengthening activities prescribed by your caregiver, physical therapist, or athletic trainer. Use a heat pack or soak your injury in warm water. SEEK MEDICAL CARE IF:   Symptoms get worse or do  not improve despite treatment.  Pain becomes intolerable.  You experience numbness or tingling.  Toes or fingernails become cold or develop a blue, Sara Mendez, or dusky color.  New, unexplained symptoms develop (drugs used in treatment may produce side effects). Document Released: 04/29/2005 Document Revised: 07/22/2011 Document Reviewed: 08/11/2008 [facility] Patient Information 2015 [place], [place]. This information is not intended to replace advice given to you by your health care provider. Make sure you discuss any questions you have with your health care provider.  Sling Use After Injury or Surgery You have been put in a sling today because of an injury or following surgery. If you have a tendon or bone injury it may take up to 6 weeks to heal. Use the sling as directed until your caregiver says it is no longer needed. The sling protects and keeps you from using the injured part. Hanging your arm in a sling will give rest and support to the injured part. This also helps with comfort and healing. Slings are used for injuries made worse or more painful by movement. Examples include:  Broken arms.  Broken collarbones.  Shoulder injuries.  Following surgery. The sling should fit comfortably, with your elbow at one end of the sling and your hand at the other end. Your elbow is bent 90 degrees lying across your waist and rests in the sling with your thumb pointing up. Make sure that the hand of the injured arm does not droop down. That could stretch some nerves in the wrist. Your hand should be slightly higher than your elbow. You may also pad the sling behind your neck with some cloth or foam rubber.  A swathe may also be used if it is necessary to keep you from lifting your injured arm. A swathe is a wrap or ace bandage that goes around your chest over your injured arm.  To take the weight off your neck, some slings have a strap that goes around your neck and down your back. One strap is connected  to the closed elbow side of the sling with the other end of the strap attached to the wrist side. With a sling like this, your injured shoulder, arm, wrist, or hand is in the sling, the weight is more on your shoulder and back. This is different from the illustration where the sling is supported only by the neck.  In an emergency, a sling can be as simple as a belt or towel tied around your neck to hold your forearm.  HOME CARE INSTRUCTIONS   Do not use your shoulder until instructed to by your caregiver.  If you have been prescribed physical therapy, keep appointments as directed.  For the first couple days following your injury and during times when you are sore, you may use ice on the injured area for 15-20 minutes 03-04 times per day while awake. Put the ice in a plastic bag and place a towel between the bag of ice and your skin. This will help keep the swelling down.  If there is numbness in the fifth finger and ring fingers you may need to pad the elbow to relieve pressure on the ulnar nerve (the crazy bone).  Keep your arm on your chest when lying down.  If a  plaster splint was applied, wear the splint until you are seen for a follow-up examination. Rest it on nothing harder than a pillow the first 24 hours. Do not get it wet. You may take it off to take a shower or bath unless instructed otherwise by your caregiver.  You may have been given an elastic bandage to use with the plaster splint or alone. The splint is too tight if you have numbness, tingling, or if your hand becomes cold and blue. Adjust or reapply the bandage to make it comfortable.  Only take over-the-counter or prescription medicines for pain, discomfort, or fever as directed by your caregiver.  If range of motion exercises are permitted by your caregiver, do not go over the limits suggested. If you have increased pain from doing gentle exercises, stop the exercises until you see your caregiver again.  The length of time  needed for healing depends on what your injury or surgery was. SEEK IMMEDIATE MEDICAL CARE IF:   You have an increase in bruising, swelling or pain in the area of your injury or surgery.  You notice a blue color of or coldness in your fingers.  Pain relief is not obtained with medications or any of your problems are getting worse. Document Released: 12/12/2003 Document Revised: 04/15/2012 Document Reviewed: 03/14/2007 [facility] Patient Information 2015 [place], [place]. This information is not intended to replace advice given to you by your health care provider. Make sure you discuss any questions you have with your health care provider.

## 2014-06-06 NOTE — ED Provider Notes (Signed)
CSN: [id number]     Arrival date & time 06/06/14  1153 History  This chart was scribed for non-physician practitioner, Domenic Moras, PA-C working with Dorie Rank, MD by Frederich Balding, ED scribe. This patient was seen in room WTR8/WTR8 and the patient's care was started at 12:10 PM.   Chief Complaint  Patient presents with  . Shoulder Pain   The history is provided by the patient. No language interpreter was used.    HPI Comments: Sara Mendez is a 59 y.o. female who presents to the Emergency Department complaining of a fall that occurred this morning. States she tripped on carpet at work and fell forward, trying to catch herself with her left arm. Reports hitting her head on the carpet but denies LOC. Denies any symptoms prior to the fall. She has sudden onset left shoulder pain and gradual onset headache. Shoulder movements worsen pain. Pt has not yet taken any medications. Denies elbow pain, wrist pain, hip pain, knee pain. Pt is left hand dominant. sts headache is minimal at this time.  No significant neck pain.    Past Medical History  Diagnosis Date  . DJD (degenerative joint disease)   . Wears glasses    Past Surgical History  Procedure Laterality Date  . Replacement total knee  2011    right and left total knees in [place]  . Gastric bypass  10/13    has lost 100 lb  . Knee arthroscopy  2008    left  . Ankle fracture surgery  2008    lt  . Breast surgery  2007    lt br bx  . Tooth extraction Right 08/24/2012    Procedure: SURGICAL REMOVAL OF ODONTOGENIC KERATOCYST AND EXTRACT TEETH NUMBERS 30, AND 31;  Surgeon: Ceasar Mons, DDS;  Location: [facility];  Service: Oral Surgery;  Laterality: Right;   No family history on file. History  Substance Use Topics  . Smoking status: Never Smoker   . Smokeless tobacco: Not on file  . Alcohol Use: No   OB History    No data available     Review of Systems  Musculoskeletal: Positive for arthralgias.   Neurological: Positive for headaches.  All other systems reviewed and are negative.  Allergies  Penicillins  Home Medications   Prior to Admission medications   Medication Sig Start Date End Date Taking? Authorizing Provider  cephALEXin (KEFLEX) 500 MG capsule Take 1 capsule (500 mg total) by mouth 4 (four) times daily. 08/24/12   Ceasar Mons, DDS  hydrocodone-ibuprofen (VICOPROFEN) 5-200 MG per tablet Take 1 tablet by mouth every 8 (eight) hours as needed for pain. 08/24/12   Ceasar Mons, DDS  Multiple Vitamins-Minerals (MULTIVITAMIN WITH MINERALS) tablet Take 1 tablet by mouth daily.    Historical Provider, MD  promethazine (PHENERGAN) 25 MG/ML injection Inject 1 mL (25 mg total) into the muscle every 6 (six) hours as needed for nausea or vomiting. 08/24/12   Ceasar Mons, DDS   BP 128/74 mmHg  Pulse 55  Temp(Src) 98.1 F (36.7 C) (Oral)  Resp 16  SpO2 100%   Physical Exam  Constitutional: She is oriented to person, place, and time. She appears well-developed and well-nourished. No distress.  HENT:  Head: Normocephalic and atraumatic.  No midface tenderness.  Eyes: Conjunctivae and EOM are normal.  Neck: Neck supple. No tracheal deviation present.  Cardiovascular: Normal rate.   Radial pulse 2+. Brisk capillary refill.  Pulmonary/Chest: Effort normal. No  respiratory distress.  Musculoskeletal: Normal range of motion.  Left shoulder with point tenderness to palpation at the glenohumeral joint without any gross deformity. Pain along the proximal humeral region. Elbow and wrist normal.  Neurological: She is alert and oriented to person, place, and time.  Skin: Skin is warm and dry.  Psychiatric: She has a normal mood and affect. Her behavior is normal.  Nursing note and vitals reviewed.   ED Course  Procedures (including critical care time)  DIAGNOSTIC STUDIES: Oxygen Saturation is 100% on RA, normal by my interpretation.    COORDINATION OF CARE: 12:12  PM-Discussed treatment plan which includes xray and pain medication with pt at bedside and pt agreed to plan.   1:10 PM Xray of L shoulder is unremarkable.  Likely shoulder strain.  Sling provided for support to use temporarily.  RICE therapy discussed.  Ortho referral as needed.  She is NVI.    Labs Review Labs Reviewed - No data to display  Imaging Review Dg Shoulder Left  06/06/2014   CLINICAL DATA:  Tripped on carpet at work this morning, falling forward.  EXAM: LEFT SHOULDER - 2+ VIEW  COMPARISON:  07/12/2013  FINDINGS: Three views of the left shoulder are negative for acute fracture or dislocation. There is calcification anterior to the bicipital groove on the axillary view, perhaps sequelae of calcific tendinitis. There is no bone lesion or bone destruction.  IMPRESSION: Negative for acute fracture   Electronically Signed   By: Andreas Newport M.D.   On: 06/06/2014 12:44     EKG Interpretation None      MDM   Final diagnoses:  Fall  Left shoulder strain, initial encounter    BP 128/74 mmHg  Pulse 55  Temp(Src) 98.1 F (36.7 C) (Oral)  Resp 16  SpO2 100%  I have reviewed nursing notes and vital signs. I personally reviewed the imaging tests through PACS system  I reviewed available ER/hospitalization records thought the EMR   I personally performed the services described in this documentation, which was scribed in my presence. The recorded information has been reviewed and is accurate.  Domenic Moras, PA-C 06/06/14 [place], MD 06/07/14 [phone]

## 2016-05-02 ENCOUNTER — Emergency Department (HOSPITAL_COMMUNITY): Payer: 59

## 2016-05-02 ENCOUNTER — Emergency Department (HOSPITAL_COMMUNITY)
Admission: EM | Admit: 2016-05-02 | Discharge: 2016-05-02 | Disposition: A | Discharge status: Home / Self Care | Payer: 59 | Attending: Emergency Medicine | Admitting: Emergency Medicine

## 2016-05-02 ENCOUNTER — Encounter (HOSPITAL_COMMUNITY): Payer: Self-pay

## 2016-05-02 DIAGNOSIS — Y999 Unspecified external cause status: Secondary | ICD-10-CM | POA: Diagnosis not present

## 2016-05-02 DIAGNOSIS — S52572A Other intraarticular fracture of lower end of left radius, initial encounter for closed fracture: Secondary | ICD-10-CM | POA: Diagnosis not present

## 2016-05-02 DIAGNOSIS — Z79899 Other long term (current) drug therapy: Secondary | ICD-10-CM | POA: Diagnosis not present

## 2016-05-02 DIAGNOSIS — S6992XA Unspecified injury of left wrist, hand and finger(s), initial encounter: Secondary | ICD-10-CM | POA: Diagnosis present

## 2016-05-02 DIAGNOSIS — Y939 Activity, unspecified: Secondary | ICD-10-CM | POA: Diagnosis not present

## 2016-05-02 DIAGNOSIS — Z96653 Presence of artificial knee joint, bilateral: Secondary | ICD-10-CM | POA: Diagnosis not present

## 2016-05-02 DIAGNOSIS — W1839XA Other fall on same level, initial encounter: Secondary | ICD-10-CM | POA: Insufficient documentation

## 2016-05-02 DIAGNOSIS — Y929 Unspecified place or not applicable: Secondary | ICD-10-CM | POA: Insufficient documentation

## 2016-05-02 DIAGNOSIS — S52502A Unspecified fracture of the lower end of left radius, initial encounter for closed fracture: Secondary | ICD-10-CM

## 2016-05-02 DIAGNOSIS — I2510 Atherosclerotic heart disease of native coronary artery without angina pectoris: Secondary | ICD-10-CM | POA: Diagnosis not present

## 2016-05-02 HISTORY — DX: Atherosclerotic heart disease of native coronary artery without angina pectoris: I25.10

## 2016-05-02 MED ORDER — OXYCODONE-ACETAMINOPHEN 5-325 MG PO TABS
2.0000 | ORAL_TABLET | Freq: Once | ORAL | Status: AC
  Administered 2016-05-02: 2 via ORAL
  Filled 2016-05-02: qty 2

## 2016-05-02 MED ORDER — HYDROMORPHONE HCL 2 MG/ML IJ SOLN
1.0000 mg | Freq: Once | INTRAMUSCULAR | Status: AC
  Administered 2016-05-02: 1 mg via INTRAVENOUS
  Filled 2016-05-02: qty 1

## 2016-05-02 MED ORDER — OXYCODONE-ACETAMINOPHEN 5-325 MG PO TABS: 1.0000 | ORAL_TABLET | ORAL | 0 refills | Status: DC | PRN

## 2016-05-02 MED ORDER — LIDOCAINE HCL (PF) 1 % IJ SOLN: 30.0000 mL | Freq: Once | INTRAMUSCULAR | Status: DC

## 2016-05-02 MED ORDER — LIDOCAINE HCL 2 % IJ SOLN
20.0000 mL | Freq: Once | INTRAMUSCULAR | Status: AC
  Administered 2016-05-02: 400 mg via INTRADERMAL
  Filled 2016-05-02: qty 20

## 2016-05-02 MED ORDER — ONDANSETRON HCL 4 MG/2ML IJ SOLN
4.0000 mg | Freq: Once | INTRAMUSCULAR | Status: AC
  Administered 2016-05-02: 4 mg via INTRAVENOUS
  Filled 2016-05-02: qty 2

## 2016-05-02 NOTE — ED Notes (Signed)
Family at bedside. 

## 2016-05-02 NOTE — ED Notes (Signed)
[facility] AT [facility] TO PERFORM REDUCTION

## 2016-05-02 NOTE — ED Notes (Signed)
Patient d/c'd self care.  F/U and medications reviewed.  Patient verbalized understanding. 

## 2016-05-02 NOTE — ED Triage Notes (Signed)
Per [facility]- Pt resides at home. Pt wearing heels and caught heels on carpet. Fall NO LOC. Denies neck and back pain [facility] cleared. C/o of left wrist pain DEFORMITY PRESENT LEFT. Splint and sling to left side. Fentanyl 250 MCG IVP given in route with little relief. Good CMS.

## 2016-05-02 NOTE — ED Notes (Signed)
Attempted to have patient sign d/c' papers was unable to get signature pad in room to work.  Patient verbalized understanding of instructions and medications.

## 2016-05-02 NOTE — ED Provider Notes (Signed)
[facility] DEPT Provider Note   CSN: [id number] Arrival date & time: 05/02/16  1355     History   Chief Complaint Chief Complaint  Patient presents with  . Fall  . Wrist Pain    left deformity    HPI Sara Mendez is a 60 y.o. female.  HPI  60 year old female presents after a fall. She states she thinks her heels got caught in the string of a rug and she fell backwards. Landed on her left side. Has left wrist and forearm pain. Thinks she hit her head and has a mild headache but did not lose consciousness. She has had nausea since being given fentanyl by EMS but did not have nausea or vomiting prior to this. No shoulder pain or upper arm pain. No elbow pain. No other pain or injury. Pain is currently severe.  Past Medical History:  Diagnosis Date  . Coronary artery disease   . Diverticulitis   . DJD (degenerative joint disease)   . Wears glasses     Patient Active Problem List   Diagnosis Date Noted  . ROTATOR CUFF INJURY, LEFT SHOULDER 09/23/2008  . DEGENERATIVE JOINT DISEASE, KNEES, BILATERAL 07/15/2008  . OBESITY 05/17/2008  . CALCANEAL SPUR, RIGHT 05/17/2008    Past Surgical History:  Procedure Laterality Date  . ANKLE FRACTURE SURGERY  2008   lt  . BREAST SURGERY  2007   lt br bx  . GASTRIC BYPASS  10/13   has lost 100 lb  . KNEE ARTHROSCOPY  2008   left  . REPLACEMENT TOTAL KNEE  2011   right and left total knees in [place]  . TOOTH EXTRACTION Right 08/24/2012   Procedure: SURGICAL REMOVAL OF ODONTOGENIC KERATOCYST AND EXTRACT TEETH NUMBERS 30, AND 31;  Surgeon: Ceasar Mons, DDS;  Location: [facility];  Service: Oral Surgery;  Laterality: Right;    OB History    No data available       Home Medications    Prior to Admission medications   Medication Sig Start Date End Date Taking? Authorizing Provider  acetaminophen (TYLENOL) 500 MG tablet Take 500-1,000 mg by mouth every 6 (six) hours as needed for mild pain.   Yes  Historical Provider, MD  cholecalciferol (VITAMIN D) 1000 units tablet Take 1,000 Units by mouth at bedtime.   Yes Historical Provider, MD  Multiple Vitamins-Minerals (MULTIVITAMIN WITH MINERALS) tablet Take 1 tablet by mouth at bedtime.    Yes Historical Provider, MD  oxyCODONE-acetaminophen (PERCOCET) 5-325 MG tablet Take 1-2 tablets by mouth every 4 (four) hours as needed for severe pain. 05/02/16   Sherwood Gambler, MD    Family History No family history on file.  Social History Social History  Substance Use Topics  . Smoking status: Never Smoker  . Smokeless tobacco: Never Used  . Alcohol use No     Allergies   Penicillins   Review of Systems Review of Systems  Gastrointestinal: Positive for nausea (after fentanyl). Negative for vomiting.  Musculoskeletal: Positive for arthralgias and joint swelling.  Neurological: Positive for headaches. Negative for syncope, weakness and numbness.  All other systems reviewed and are negative.    Physical Exam Updated Vital Signs BP 129/69 (BP Location: Right Arm)   Pulse 60   Temp 98.1 F (36.7 C) (Oral)   Resp 20   Ht 5\' 5"  (1.651 m)   Wt 210 lb (95.3 kg)   SpO2 100%   BMI 34.95 kg/m   Physical Exam  Constitutional:  She is oriented to person, place, and time. She appears well-developed and well-nourished. She appears distressed (in pain).  HENT:  Head: Normocephalic and atraumatic.  Right Ear: External ear normal.  Left Ear: External ear normal.  Nose: Nose normal.  No scalp swelling or tenderness  Eyes: Right eye exhibits no discharge. Left eye exhibits no discharge.  Cardiovascular: Normal rate and regular rhythm.   Pulses:      Radial pulses are 2+ on the left side.  Pulmonary/Chest: Effort normal.  Abdominal: She exhibits no distension.  Musculoskeletal:       Left shoulder: She exhibits no tenderness.       Left elbow: No tenderness found.       Left wrist: She exhibits tenderness, swelling and deformity. She  exhibits no laceration.       Left upper arm: She exhibits no tenderness.       Left forearm: She exhibits tenderness (mid-forearm).       Left hand: She exhibits no tenderness and no swelling.  Neurological: She is alert and oriented to person, place, and time.  Skin: Skin is warm and dry. She is not diaphoretic.  Nursing note and vitals reviewed.    ED Treatments / Results  Labs (all labs ordered are listed, but only abnormal results are displayed) Labs Reviewed - No data to display  EKG  EKG Interpretation None       Radiology Dg Forearm Left  Result Date: 05/02/2016 CLINICAL DATA:  Fall on outstretched hand with diffuse left wrist pain and deformity. EXAM: LEFT FOREARM - 2 VIEW COMPARISON:  None. FINDINGS: Comminuted distal radial fracture with intra-articular extension, transverse component, and apex volar angulation. With fracture plane extends into the distal radioulnar joint. Transverse fracture of the ulnar styloid likewise appears acute. No additional forearm fracture is identified. IMPRESSION: 1. Comminuted distal radial fracture with a transverse metaphyseal component and a longitudinal component extending through the distal fragment to the articular surface. 2. Transverse fracture of the ulnar styloid. Electronically Signed   By: Van Clines M.D.   On: 05/02/2016 15:06   Dg Wrist Complete Left  Result Date: 05/02/2016 CLINICAL DATA:  Pain after fall EXAM: LEFT WRIST - COMPLETE 3+ VIEW COMPARISON:  05/02/2016 FINDINGS: Interim casting of the wrist which obscures bone detail. Comminuted intra-articular distal radius fracture, mildly impacted. Minimal dorsal angulation, slightly decreased. Comminuted ulnar styloid process fracture unchanged. IMPRESSION: Interim casting of the wrist. Comminuted impacted intra-articular distal radius fracture with slight decreased dorsal angulation. Comminuted ulnar styloid process fracture. Electronically Signed   By: Donavan Foil M.D.    On: 05/02/2016 19:50    Procedures .Nerve Block Date/Time: 05/03/2016 12:56 AM Performed by: Sherwood Gambler Authorized by: Sherwood Gambler   Consent:    Consent obtained:  Verbal and written   Consent given by:  Patient   Risks discussed:  Allergic reaction, infection, nerve damage, swelling, unsuccessful block, pain, intravenous injection and bleeding   Alternatives discussed:  No treatment Indications:    Indications:  Procedural anesthesia Location:    Body area:  Upper extremity   Upper extremity nerve blocked: wrist hematoma.   Laterality:  Right Pre-procedure details:    Skin preparation:  Povidone-iodine   Preparation: Patient was prepped and draped in usual sterile fashion   Skin anesthesia (see MAR for exact dosages):    Skin anesthesia method:  Local infiltration   Local anesthetic:  Lidocaine 2% w/o epi Procedure details (see MAR for exact dosages):    Block  needle gauge:  22 G   Anesthetic injected:  Lidocaine 2% w/o epi   Steroid injected:  None   Additive injected:  None   Injection procedure:  Anatomic landmarks identified Post-procedure details:    Dressing:  None   Outcome:  Anesthesia achieved   Patient tolerance of procedure:  Tolerated well, no immediate complications   (including critical care time)  Reduction of dislocation Date/Time: 03/14/16 Performed by: Sherwood Gambler T Authorized by: Sherwood Gambler T Consent: Verbal consent obtained. Risks and benefits: risks, benefits and alternatives were discussed Consent given by: patient Required items: required blood products, implants, devices, and special equipment available Time out: Immediately prior to procedure a "time out" was called to verify the correct patient, procedure, equipment, support staff and site/side marked as required.  Patient sedated: NO - hematoma block   Vitals: Vital signs were monitored during sedation. Patient tolerance: Patient tolerated the procedure well with no  immediate complications. Joint: right wrist Reduction technique: traction, then transitioned to finger traps   Medications Ordered in ED Medications  HYDROmorphone (DILAUDID) injection 1 mg (1 mg Intravenous Given 05/02/16 1505)  ondansetron (ZOFRAN) injection 4 mg (4 mg Intravenous Given 05/02/16 1502)  HYDROmorphone (DILAUDID) injection 1 mg (1 mg Intravenous Given 05/02/16 1649)  lidocaine (XYLOCAINE) 2 % (with pres) injection 400 mg (400 mg Intradermal Given 05/02/16 2021)  oxyCODONE-acetaminophen (PERCOCET/ROXICET) 5-325 MG per tablet 2 tablet (2 tablets Oral Given 05/02/16 1928)     Initial Impression / Assessment and Plan / ED Course  I have reviewed the triage vital signs and the nursing notes.  Pertinent labs & imaging results that were available during my care of the patient were reviewed by me and considered in my medical decision making (see chart for details).  Clinical Course as of May 03 53  Thu May 02, 2016  1441 Xray, npo, dilaudid for pain. She has a mild headache but no LOC, no severe headache, vomiting. Low concern for serious head injury. No CT needed.  [SG]  1534 Feels much better after IV dilaudid. Hand consult pending  [SG]  [facility] asks for splint and f/u in office. Will do hematoma block and reduce. Patient consents.  [SG]  2003 Wrist alignment appears better in my view of xray. Will d/c with hand f/u  [SG]    Clinical Course User Index [SG] Sherwood Gambler, MD    Patient has remained NV intact. Better alignment after reduction. No signs of other injury. Percocet for pain at home, as well as nsaids, ice, tylenol. Discussed return precautions.   Final Clinical Impressions(s) / ED Diagnoses   Final diagnoses:  Closed fracture of distal end of left radius, unspecified fracture morphology, initial encounter    New Prescriptions Discharge Medication List as of 05/02/2016  8:06 PM    START taking these medications   Details  oxyCODONE-acetaminophen  (PERCOCET) 5-325 MG tablet Take 1-2 tablets by mouth every 4 (four) hours as needed for severe pain., Starting Thu 05/02/2016, Print         Sherwood Gambler, MD 05/03/16 [phone]

## 2016-05-02 NOTE — ED Notes (Signed)
Ice applied to left forearm

## 2016-05-05 ENCOUNTER — Emergency Department (HOSPITAL_COMMUNITY)
Admission: EM | Admit: 2016-05-05 | Discharge: 2016-05-05 | Disposition: A | Discharge status: Home / Self Care | Payer: 59 | Attending: Emergency Medicine | Admitting: Emergency Medicine

## 2016-05-05 ENCOUNTER — Emergency Department (HOSPITAL_COMMUNITY): Payer: 59

## 2016-05-05 ENCOUNTER — Encounter (HOSPITAL_COMMUNITY): Payer: Self-pay | Admitting: Oncology

## 2016-05-05 DIAGNOSIS — S52502D Unspecified fracture of the lower end of left radius, subsequent encounter for closed fracture with routine healing: Secondary | ICD-10-CM | POA: Diagnosis not present

## 2016-05-05 DIAGNOSIS — Z79899 Other long term (current) drug therapy: Secondary | ICD-10-CM | POA: Insufficient documentation

## 2016-05-05 DIAGNOSIS — Z96653 Presence of artificial knee joint, bilateral: Secondary | ICD-10-CM | POA: Diagnosis not present

## 2016-05-05 DIAGNOSIS — W1830XD Fall on same level, unspecified, subsequent encounter: Secondary | ICD-10-CM | POA: Diagnosis not present

## 2016-05-05 DIAGNOSIS — S62102D Fracture of unspecified carpal bone, left wrist, subsequent encounter for fracture with routine healing: Secondary | ICD-10-CM

## 2016-05-05 DIAGNOSIS — I2510 Atherosclerotic heart disease of native coronary artery without angina pectoris: Secondary | ICD-10-CM | POA: Diagnosis not present

## 2016-05-05 DIAGNOSIS — S59912D Unspecified injury of left forearm, subsequent encounter: Secondary | ICD-10-CM | POA: Diagnosis present

## 2016-05-05 MED ORDER — OXYCODONE-ACETAMINOPHEN 5-325 MG PO TABS: 1.0000 | ORAL_TABLET | ORAL | 0 refills | Status: DC | PRN

## 2016-05-05 MED ORDER — CYCLOBENZAPRINE HCL 10 MG PO TABS: 10.0000 mg | ORAL_TABLET | Freq: Two times a day (BID) | ORAL | 0 refills | Status: DC | PRN

## 2016-05-05 MED ORDER — OXYCODONE-ACETAMINOPHEN 5-325 MG PO TABS
1.0000 | ORAL_TABLET | Freq: Once | ORAL | Status: AC
  Administered 2016-05-05: 1 via ORAL
  Filled 2016-05-05: qty 1

## 2016-05-05 NOTE — ED Notes (Signed)
ORTHO [facility] PRESENT TO REMOVE CAST XRAY UPDATED

## 2016-05-05 NOTE — ED Provider Notes (Signed)
[facility] DEPT Provider Note   CSN: [id number] Arrival date & time: 05/05/16  0620     History   Chief Complaint Chief Complaint  Patient presents with  . Wrist Pain    Cast Check    HPI Sara Mendez is a 60 y.o. female.  HPI   60 year old female presenting complaining of left wrist pain. Patient had a mechanical fall 3 days ago and injured her left wrist. X-ray demonstrate a fracture at the distal end of the left radius. This was a closed fracture. The wrist was placed in a splint. Patient prescribed Percocet for pain as well as NSAIDs and rice therapy. She is supposed to follow-up with hand specialist, Dr. Lenon Curt. She is here today complaining of increasing sharp throbbing pain to her left wrist for the past day and half not adequately controlled with pain medication at home. She noticed increased swelling to her fingers but denies numbness. She is concerned that there skin may have shifted and pushing against the bone. She denies any new injury. She has been taking pain medication as prescribed.   Past Medical History:  Diagnosis Date  . Coronary artery disease   . Diverticulitis   . DJD (degenerative joint disease)   . Wears glasses     Patient Active Problem List   Diagnosis Date Noted  . ROTATOR CUFF INJURY, LEFT SHOULDER 09/23/2008  . DEGENERATIVE JOINT DISEASE, KNEES, BILATERAL 07/15/2008  . OBESITY 05/17/2008  . CALCANEAL SPUR, RIGHT 05/17/2008    Past Surgical History:  Procedure Laterality Date  . ANKLE FRACTURE SURGERY  2008   lt  . BREAST SURGERY  2007   lt br bx  . GASTRIC BYPASS  10/13   has lost 100 lb  . KNEE ARTHROSCOPY  2008   left  . REPLACEMENT TOTAL KNEE  2011   right and left total knees in [place]  . TOOTH EXTRACTION Right 08/24/2012   Procedure: SURGICAL REMOVAL OF ODONTOGENIC KERATOCYST AND EXTRACT TEETH NUMBERS 30, AND 31;  Surgeon: Ceasar Mons, DDS;  Location: [facility];  Service: Oral Surgery;  Laterality:  Right;    OB History    No data available       Home Medications    Prior to Admission medications   Medication Sig Start Date End Date Taking? Authorizing Provider  acetaminophen (TYLENOL) 500 MG tablet Take 500-1,000 mg by mouth every 6 (six) hours as needed for mild pain.    Historical Provider, MD  cholecalciferol (VITAMIN D) 1000 units tablet Take 1,000 Units by mouth at bedtime.    Historical Provider, MD  Multiple Vitamins-Minerals (MULTIVITAMIN WITH MINERALS) tablet Take 1 tablet by mouth at bedtime.     Historical Provider, MD  oxyCODONE-acetaminophen (PERCOCET) 5-325 MG tablet Take 1-2 tablets by mouth every 4 (four) hours as needed for severe pain. 05/02/16   Sherwood Gambler, MD    Family History No family history on file.  Social History Social History  Substance Use Topics  . Smoking status: Never Smoker  . Smokeless tobacco: Never Used  . Alcohol use No     Allergies   Penicillins   Review of Systems Review of Systems  Constitutional: Negative for fever.  Musculoskeletal: Positive for arthralgias.  Neurological: Negative for numbness.     Physical Exam Updated Vital Signs BP 110/74 (BP Location: Right Arm)   Pulse 68   Temp 97.9 F (36.6 C) (Oral)   Resp 15   Ht 5\' 5"  (1.651 m)  Wt 95.3 kg   SpO2 98%   BMI 34.95 kg/m   Physical Exam  Constitutional: She appears well-developed and well-nourished. No distress.  HENT:  Head: Atraumatic.  Eyes: Conjunctivae are normal.  Neck: Neck supple.  Musculoskeletal: She exhibits tenderness (L wrist: splint removed.  tenderness throughout wrist without skin breakdown.  radial pulse 2+, sensation intact to fingers).  Neurological: She is alert.  Skin: No rash noted.  Psychiatric: She has a normal mood and affect.  Nursing note and vitals reviewed.    ED Treatments / Results  Labs (all labs ordered are listed, but only abnormal results are displayed) Labs Reviewed - No data to display  EKG  EKG  Interpretation None       Radiology Dg Wrist Complete Left  Result Date: 05/05/2016 CLINICAL DATA:  History of distal radius and ulnar styloid fractures due to a fall 05/02/2016. The patient reports her cast moved. EXAM: LEFT WRIST - COMPLETE 3+ VIEW COMPARISON:  Plain films left wrist 05/02/2016. FINDINGS: Comminuted intra-articular fracture of the distal radius with mild dorsal angulation and impaction appears unchanged. Ulnar styloid fracture is also unchanged in appearance. Soft tissue swelling is present about the wrist. IMPRESSION: No notable change in position or alignment of the patient's distal radius and ulnar styloid fractures. No new abnormality. Electronically Signed   By: Inge Rise M.D.   On: 05/05/2016 11:02    Procedures Procedures (including critical care time)  Medications Ordered in ED Medications - No data to display   Initial Impression / Assessment and Plan / ED Course  I have reviewed the triage vital signs and the nursing notes.  Pertinent labs & imaging results that were available during my care of the patient were reviewed by me and considered in my medical decision making (see chart for details).  Clinical Course     BP 130/63 (BP Location: Right Arm)   Pulse 60   Temp 98.6 F (37 C) (Oral)   Resp 16   Ht 5\' 5"  (1.651 m)   Wt 95.3 kg   SpO2 100%   BMI 34.95 kg/m    Final Clinical Impressions(s) / ED Diagnoses   Final diagnoses:  Closed fracture of left wrist with routine healing, subsequent encounter    New Prescriptions Discharge Medication List as of 05/05/2016 11:36 AM    START taking these medications   Details  cyclobenzaprine (FLEXERIL) 10 MG tablet Take 1 tablet (10 mg total) by mouth 2 (two) times daily as needed for muscle spasms., Starting Sun 05/05/2016, Print       7:53 AM Pt here with worsening L wrist pain from recent injury 3 days ago when she broke her L wrist.  Will remove splint to assess skin integrity and  will re-xray her L wrist.  Pain medication given.   10:35 AM Wrist splint was removed.  Although pt has moderate edema and ecchymosis, radial pulse 2+ and sensation is intact.  Doubt compartment syndrome as her compartment is soft.  Will re-xray and if no significant changes I will reapply posterior splint and prescribe pain medication.  Pt currently in moderate pain.    12:19 PM Xray without significant changes.  After replacement of sugar tong splint pt felt much better.  Stable for discharge with outpt f/u with hand on Tuesday (3 days from now).  Will provide additional pain medication.   Domenic Moras, PA-C 05/05/16 1220    Lacretia Leigh, MD 05/05/16 [phone]

## 2016-05-05 NOTE — ED Notes (Signed)
Ice pack and sling

## 2016-05-05 NOTE — ED Triage Notes (Signed)
Pt broke left wrist on Thursday and was splinted. Pt states, "I think the cast shifted and it's now rubbing up against my broken bone." PMS intact. No significant amount of swelling noted.

## 2016-05-05 NOTE — Discharge Instructions (Signed)
Please keep your wrist elevated when resting.  Take pain medication as needed for pain.  Follow up with hand specialist for further care.

## 2016-05-05 NOTE — ED Notes (Signed)
Pt informed Ortho in route

## 2016-05-05 NOTE — ED Notes (Signed)
Patient transported to X-ray 

## 2016-05-05 NOTE — ED Notes (Signed)
ORTHO COMPLETE

## 2016-05-08 ENCOUNTER — Ambulatory Visit (HOSPITAL_COMMUNITY)
Admission: RE | Admit: 2016-05-08 | Discharge: 2016-05-09 | Disposition: A | Discharge status: Home / Self Care | Payer: Worker's Compensation | Source: Ambulatory Visit | Attending: Orthopedic Surgery | Admitting: Orthopedic Surgery

## 2016-05-08 ENCOUNTER — Ambulatory Visit (HOSPITAL_COMMUNITY): Payer: Worker's Compensation | Admitting: Anesthesiology

## 2016-05-08 ENCOUNTER — Encounter (HOSPITAL_COMMUNITY): Payer: Self-pay | Admitting: Urology

## 2016-05-08 ENCOUNTER — Encounter (HOSPITAL_COMMUNITY)
Admission: RE | Disposition: A | Discharge status: Home / Self Care | Payer: Self-pay | Source: Ambulatory Visit | Attending: Orthopedic Surgery

## 2016-05-08 DIAGNOSIS — Z9884 Bariatric surgery status: Secondary | ICD-10-CM | POA: Diagnosis not present

## 2016-05-08 DIAGNOSIS — Z96653 Presence of artificial knee joint, bilateral: Secondary | ICD-10-CM | POA: Diagnosis not present

## 2016-05-08 DIAGNOSIS — M1990 Unspecified osteoarthritis, unspecified site: Secondary | ICD-10-CM | POA: Insufficient documentation

## 2016-05-08 DIAGNOSIS — S52502A Unspecified fracture of the lower end of left radius, initial encounter for closed fracture: Secondary | ICD-10-CM | POA: Diagnosis present

## 2016-05-08 DIAGNOSIS — S52572A Other intraarticular fracture of lower end of left radius, initial encounter for closed fracture: Secondary | ICD-10-CM | POA: Diagnosis present

## 2016-05-08 DIAGNOSIS — W19XXXA Unspecified fall, initial encounter: Secondary | ICD-10-CM | POA: Insufficient documentation

## 2016-05-08 DIAGNOSIS — I2510 Atherosclerotic heart disease of native coronary artery without angina pectoris: Secondary | ICD-10-CM | POA: Diagnosis not present

## 2016-05-08 DIAGNOSIS — Z880 Allergy status to penicillin: Secondary | ICD-10-CM | POA: Diagnosis not present

## 2016-05-08 HISTORY — PX: OPEN REDUCTION INTERNAL FIXATION (ORIF) DISTAL RADIAL FRACTURE: SHX5989

## 2016-05-08 SURGERY — OPEN REDUCTION INTERNAL FIXATION (ORIF) DISTAL RADIUS FRACTURE
Anesthesia: Monitor Anesthesia Care | Laterality: Left

## 2016-05-08 MED ORDER — OXYCODONE-ACETAMINOPHEN 5-325 MG PO TABS
1.0000 | ORAL_TABLET | ORAL | Status: DC | PRN
  Administered 2016-05-09 (×2): 2 via ORAL
  Filled 2016-05-08 (×2): qty 2

## 2016-05-08 MED ORDER — CYCLOBENZAPRINE HCL 10 MG PO TABS: 10.0000 mg | ORAL_TABLET | Freq: Two times a day (BID) | ORAL | Status: DC | PRN

## 2016-05-08 MED ORDER — OXYCODONE HCL 5 MG/5ML PO SOLN: 5.0000 mg | Freq: Once | ORAL | Status: DC | PRN

## 2016-05-08 MED ORDER — METHOCARBAMOL 500 MG PO TABS
500.0000 mg | ORAL_TABLET | Freq: Four times a day (QID) | ORAL | Status: DC | PRN
  Administered 2016-05-09: 500 mg via ORAL
  Filled 2016-05-08: qty 1

## 2016-05-08 MED ORDER — FENTANYL CITRATE (PF) 100 MCG/2ML IJ SOLN
INTRAMUSCULAR | Status: AC
  Filled 2016-05-08: qty 2

## 2016-05-08 MED ORDER — ONDANSETRON HCL 4 MG/2ML IJ SOLN
INTRAMUSCULAR | Status: DC | PRN
  Administered 2016-05-08: 4 mg via INTRAVENOUS

## 2016-05-08 MED ORDER — HYDROMORPHONE HCL 2 MG/ML IJ SOLN
0.5000 mg | INTRAMUSCULAR | Status: DC | PRN
Start: 2016-05-08 — End: 2016-05-09

## 2016-05-08 MED ORDER — MIDAZOLAM HCL 2 MG/2ML IJ SOLN
INTRAMUSCULAR | Status: AC
  Filled 2016-05-08: qty 2

## 2016-05-08 MED ORDER — CLINDAMYCIN PHOSPHATE 600 MG/50ML IV SOLN
600.0000 mg | Freq: Once | INTRAVENOUS | Status: AC
  Administered 2016-05-09: 600 mg via INTRAVENOUS
  Filled 2016-05-08: qty 50

## 2016-05-08 MED ORDER — BUPIVACAINE HCL (PF) 0.25 % IJ SOLN
INTRAMUSCULAR | Status: AC
  Filled 2016-05-08: qty 30

## 2016-05-08 MED ORDER — ONDANSETRON HCL 4 MG/2ML IJ SOLN
INTRAMUSCULAR | Status: AC
  Filled 2016-05-08: qty 2

## 2016-05-08 MED ORDER — LIDOCAINE 2% (20 MG/ML) 5 ML SYRINGE
INTRAMUSCULAR | Status: AC
  Filled 2016-05-08: qty 5

## 2016-05-08 MED ORDER — OXYCODONE HCL 5 MG PO TABS: 5.0000 mg | ORAL_TABLET | Freq: Once | ORAL | Status: DC | PRN

## 2016-05-08 MED ORDER — ONDANSETRON HCL 4 MG PO TABS: 4.0000 mg | ORAL_TABLET | Freq: Four times a day (QID) | ORAL | Status: DC | PRN

## 2016-05-08 MED ORDER — CHLORHEXIDINE GLUCONATE 4 % EX LIQD: 60.0000 mL | Freq: Once | CUTANEOUS | Status: DC

## 2016-05-08 MED ORDER — MIDAZOLAM HCL 2 MG/2ML IJ SOLN
INTRAMUSCULAR | Status: DC | PRN
  Administered 2016-05-08: 2 mg via INTRAVENOUS

## 2016-05-08 MED ORDER — ONDANSETRON HCL 4 MG/2ML IJ SOLN: 4.0000 mg | Freq: Four times a day (QID) | INTRAMUSCULAR | Status: DC | PRN

## 2016-05-08 MED ORDER — ONDANSETRON HCL 4 MG/2ML IJ SOLN: 4.0000 mg | Freq: Once | INTRAMUSCULAR | Status: DC | PRN

## 2016-05-08 MED ORDER — DEXAMETHASONE SODIUM PHOSPHATE 10 MG/ML IJ SOLN
INTRAMUSCULAR | Status: DC | PRN
  Administered 2016-05-08: 10 mg via INTRAVENOUS

## 2016-05-08 MED ORDER — FENTANYL CITRATE (PF) 100 MCG/2ML IJ SOLN
INTRAMUSCULAR | Status: DC | PRN
  Administered 2016-05-08 (×2): 25 ug via INTRAVENOUS
  Administered 2016-05-08: 100 ug via INTRAVENOUS

## 2016-05-08 MED ORDER — PROPOFOL 10 MG/ML IV BOLUS
INTRAVENOUS | Status: AC
  Filled 2016-05-08: qty 20

## 2016-05-08 MED ORDER — VITAMIN C 500 MG PO TABS
1000.0000 mg | ORAL_TABLET | Freq: Every day | ORAL | Status: DC
  Administered 2016-05-09: 1000 mg via ORAL
  Filled 2016-05-08: qty 2

## 2016-05-08 MED ORDER — FENTANYL CITRATE (PF) 100 MCG/2ML IJ SOLN: 25.0000 ug | INTRAMUSCULAR | Status: DC | PRN

## 2016-05-08 MED ORDER — CLINDAMYCIN PHOSPHATE 900 MG/50ML IV SOLN
900.0000 mg | INTRAVENOUS | Status: AC
  Administered 2016-05-08: 900 mg via INTRAVENOUS
  Filled 2016-05-08: qty 50

## 2016-05-08 MED ORDER — LIDOCAINE HCL (CARDIAC) 20 MG/ML IV SOLN
INTRAVENOUS | Status: DC | PRN
  Administered 2016-05-08: 100 mg via INTRAVENOUS

## 2016-05-08 MED ORDER — ADULT MULTIVITAMIN W/MINERALS CH: 1.0000 | ORAL_TABLET | Freq: Every day | ORAL | Status: DC

## 2016-05-08 MED ORDER — DIPHENHYDRAMINE HCL 25 MG PO CAPS: 25.0000 mg | ORAL_CAPSULE | Freq: Four times a day (QID) | ORAL | Status: DC | PRN

## 2016-05-08 MED ORDER — LACTATED RINGERS IV SOLN
INTRAVENOUS | Status: DC
  Administered 2016-05-08 (×3): via INTRAVENOUS

## 2016-05-08 MED ORDER — ADULT MULTIVITAMIN W/MINERALS CH
1.0000 | ORAL_TABLET | Freq: Every day | ORAL | Status: DC
  Administered 2016-05-09: 1 via ORAL
  Filled 2016-05-08: qty 1

## 2016-05-08 MED ORDER — PROPOFOL 500 MG/50ML IV EMUL
INTRAVENOUS | Status: DC | PRN
  Administered 2016-05-08: 100 ug/kg/min via INTRAVENOUS

## 2016-05-08 MED ORDER — METHOCARBAMOL 1000 MG/10ML IJ SOLN
500.0000 mg | Freq: Four times a day (QID) | INTRAVENOUS | Status: DC | PRN
  Filled 2016-05-08: qty 5

## 2016-05-08 MED ORDER — MULTI-VITAMIN/MINERALS PO TABS: 1.0000 | ORAL_TABLET | Freq: Every day | ORAL | Status: DC

## 2016-05-08 MED ORDER — ROPIVACAINE HCL 7.5 MG/ML IJ SOLN
INTRAMUSCULAR | Status: DC | PRN
  Administered 2016-05-08: 20 mL via PERINEURAL

## 2016-05-08 MED ORDER — PROPOFOL 10 MG/ML IV BOLUS
INTRAVENOUS | Status: DC | PRN
  Administered 2016-05-08: 30 mg via INTRAVENOUS

## 2016-05-08 MED ORDER — 0.9 % SODIUM CHLORIDE (POUR BTL) OPTIME
TOPICAL | Status: DC | PRN
  Administered 2016-05-08: 1000 mL

## 2016-05-08 MED ORDER — KCL IN DEXTROSE-NACL 20-5-0.45 MEQ/L-%-% IV SOLN
INTRAVENOUS | Status: DC
  Administered 2016-05-08: 23:00:00 via INTRAVENOUS
  Filled 2016-05-08: qty 1000

## 2016-05-08 MED ORDER — DEXAMETHASONE SODIUM PHOSPHATE 10 MG/ML IJ SOLN
INTRAMUSCULAR | Status: AC
  Filled 2016-05-08: qty 1

## 2016-05-08 MED ORDER — HYDROCODONE-ACETAMINOPHEN 7.5-325 MG PO TABS: 1.0000 | ORAL_TABLET | ORAL | Status: DC | PRN

## 2016-05-08 SURGICAL SUPPLY — 59 items
BANDAGE ACE 4X5 VEL STRL LF (GAUZE/BANDAGES/DRESSINGS) ×2 IMPLANT
BANDAGE ELASTIC 3 VELCRO ST LF (GAUZE/BANDAGES/DRESSINGS) ×2 IMPLANT
BIT DRILL 2.2 SS TIBIAL (BIT) ×2 IMPLANT
BLADE SURG ROTATE 9660 (MISCELLANEOUS) IMPLANT
BNDG ELASTIC 2 VLCR STRL LF (GAUZE/BANDAGES/DRESSINGS) ×2 IMPLANT
BNDG ESMARK 4X9 LF (GAUZE/BANDAGES/DRESSINGS) ×2 IMPLANT
BNDG GAUZE ELAST 4 BULKY (GAUZE/BANDAGES/DRESSINGS) ×2 IMPLANT
CANISTER SUCTION 2500CC (MISCELLANEOUS) ×2 IMPLANT
CORDS BIPOLAR (ELECTRODE) IMPLANT
COVER SURGICAL LIGHT HANDLE (MISCELLANEOUS) ×2 IMPLANT
CUFF TOURNIQUET SINGLE 18IN (TOURNIQUET CUFF) ×2 IMPLANT
CUFF TOURNIQUET SINGLE 24IN (TOURNIQUET CUFF) IMPLANT
DECANTER SPIKE VIAL GLASS SM (MISCELLANEOUS) ×2 IMPLANT
DRAPE OEC MINIVIEW 54X84 (DRAPES) ×2 IMPLANT
DRAPE SURG 17X11 SM STRL (DRAPES) ×2 IMPLANT
DRSG ADAPTIC 3X8 NADH LF (GAUZE/BANDAGES/DRESSINGS) ×2 IMPLANT
GAUZE SPONGE 4X4 12PLY STRL (GAUZE/BANDAGES/DRESSINGS) ×2 IMPLANT
GAUZE SPONGE 4X4 16PLY XRAY LF (GAUZE/BANDAGES/DRESSINGS) ×2 IMPLANT
GLOVE BIO SURGEON STRL SZ8 (GLOVE) ×4 IMPLANT
GLOVE BIOGEL PI IND STRL 8.5 (GLOVE) ×1 IMPLANT
GLOVE BIOGEL PI INDICATOR 8.5 (GLOVE) ×1
GLOVE SURG ORTHO 8.0 STRL STRW (GLOVE) ×2 IMPLANT
GOWN STRL REUS W/ TWL LRG LVL3 (GOWN DISPOSABLE) ×1 IMPLANT
GOWN STRL REUS W/ TWL XL LVL3 (GOWN DISPOSABLE) ×3 IMPLANT
GOWN STRL REUS W/TWL LRG LVL3 (GOWN DISPOSABLE) ×1
GOWN STRL REUS W/TWL XL LVL3 (GOWN DISPOSABLE) ×3
K-WIRE 1.6 (WIRE) ×1
K-WIRE FX5X1.6XNS BN SS (WIRE) ×1
KIT BASIN OR (CUSTOM PROCEDURE TRAY) ×2 IMPLANT
KIT ROOM TURNOVER OR (KITS) ×2 IMPLANT
KWIRE FX5X1.6XNS BN SS (WIRE) ×1 IMPLANT
NEEDLE HYPO 25X1 1.5 SAFETY (NEEDLE) ×2 IMPLANT
NS IRRIG 1000ML POUR BTL (IV SOLUTION) ×2 IMPLANT
PACK ORTHO EXTREMITY (CUSTOM PROCEDURE TRAY) ×2 IMPLANT
PAD ARMBOARD 7.5X6 YLW CONV (MISCELLANEOUS) ×4 IMPLANT
PAD CAST 4YDX4 CTTN HI CHSV (CAST SUPPLIES) ×1 IMPLANT
PADDING CAST COTTON 4X4 STRL (CAST SUPPLIES) ×1
PEG LOCKING SMOOTH 2.2X20 (Screw) ×4 IMPLANT
PEG LOCKING SMOOTH 2.2X22 (Screw) ×4 IMPLANT
PEG LOCKING SMOOTH 2.2X24 (Peg) ×2 IMPLANT
PLATE STD DVR LEFT (Plate) ×2 IMPLANT
PLATE STD DVR LT 24X55 (Plate) ×1 IMPLANT
SCREW LOCK 16X2.7X 3 LD TPR (Screw) ×3 IMPLANT
SCREW LOCK 22X2.7X 3 LD TPR (Screw) ×2 IMPLANT
SCREW LOCKING 2.7X15MM (Screw) ×4 IMPLANT
SCREW LOCKING 2.7X16 (Screw) ×3 IMPLANT
SCREW LOCKING 2.7X22MM (Screw) ×2 IMPLANT
SCREW MULTI DIRECTIONAL 2.7X20 (Screw) ×2 IMPLANT
SOAP 2 % CHG 4 OZ (WOUND CARE) ×2 IMPLANT
SPONGE LAP 4X18 X RAY DECT (DISPOSABLE) ×2 IMPLANT
SUT PROLENE 4 0 P 3 18 (SUTURE) ×2 IMPLANT
SUT VIC AB 2-0 FS1 27 (SUTURE) ×2 IMPLANT
SUT VICRYL 4-0 PS2 18IN ABS (SUTURE) ×2 IMPLANT
SYR CONTROL 10ML LL (SYRINGE) IMPLANT
TOWEL OR 17X24 6PK STRL BLUE (TOWEL DISPOSABLE) ×2 IMPLANT
TOWEL OR 17X26 10 PK STRL BLUE (TOWEL DISPOSABLE) ×2 IMPLANT
TUBE CONNECTING 12X1/4 (SUCTIONS) ×2 IMPLANT
WATER STERILE IRR 1000ML POUR (IV SOLUTION) ×2 IMPLANT
YANKAUER SUCT BULB TIP NO VENT (SUCTIONS) IMPLANT

## 2016-05-08 NOTE — Anesthesia Preprocedure Evaluation (Addendum)
Anesthesia Evaluation  Patient identified by MRN, date of birth, ID band Patient awake    Reviewed: Allergy & Precautions, NPO status , Patient's Chart, lab work & pertinent test results  Airway Mallampati: I  TM Distance: >3 FB Neck ROM: Full    Dental  (+) Teeth Intact, Dental Advisory Given   Pulmonary    Pulmonary exam normal breath sounds clear to auscultation       Cardiovascular Normal cardiovascular exam Rhythm:Regular Rate:Normal     Neuro/Psych    GI/Hepatic   Endo/Other    Renal/GU      Musculoskeletal   Abdominal   Peds  Hematology   Anesthesia Other Findings   Reproductive/Obstetrics                            Anesthesia Physical Anesthesia Plan  ASA: II  Anesthesia Plan: MAC and Regional   Post-op Pain Management:    Induction: Intravenous  Airway Management Planned: Mask  Additional Equipment:   Intra-op Plan:   Post-operative Plan:   Informed Consent: I have reviewed the patients History and Physical, chart, labs and discussed the procedure including the risks, benefits and alternatives for the proposed anesthesia with the patient or authorized representative who has indicated his/her understanding and acceptance.     Plan Discussed with: CRNA and Surgeon  Anesthesia Plan Comments: (Plan GA with supraclavicular block.  Roberts Gaudy, MD)       Anesthesia Quick Evaluation

## 2016-05-08 NOTE — Anesthesia Procedure Notes (Addendum)
Anesthesia Regional Block:  Supraclavicular block  Pre-Anesthetic Checklist: ,, timeout performed, Correct Patient, Correct Site, Correct Laterality, Correct Procedure, Correct Position, site marked, Risks and benefits discussed,  Surgical consent,  Pre-op evaluation,  At surgeon's request and post-op pain management  Laterality: Left  Prep: chloraprep       Needles:   Needle Type: Echogenic Stimulator Needle     Needle Length: 9cm 9 cm Needle Gauge: 21 and 21 G    Additional Needles:  Procedures: ultrasound guided (picture in chart) and nerve stimulator Supraclavicular block  Nerve Stimulator or Paresthesia:  Response: 0.4 mA,   Additional Responses:   Narrative:  Start time: 05/08/2016 4:30 PM End time: 05/08/2016 4:40 PM Injection made incrementally with aspirations every 5 mL.  Performed by: Personally  Anesthesiologist: Lillia Abed  Additional Notes: Monitors applied. Patient sedated. Sterile prep and drape,hand hygiene and sterile gloves were used. Relevant anatomy identified.Needle position confirmed.Local anesthetic injected incrementally after negative aspiration. Local anesthetic spread visualized around nerve(s). Vascular puncture avoided. No complications. Image printed for medical record.The patient tolerated the procedure well.

## 2016-05-08 NOTE — Anesthesia Postprocedure Evaluation (Signed)
Anesthesia Post Note  Patient: Sara Mendez  Procedure(s) Performed: Procedure(s) (LRB): Left distal radius open reduction and internal fixation and repair as indicated (Left)  Patient location during evaluation: PACU Anesthesia Type: MAC Level of consciousness: awake and alert Pain management: pain level controlled Vital Signs Assessment: post-procedure vital signs reviewed and stable Respiratory status: spontaneous breathing, nonlabored ventilation, respiratory function stable and patient connected to nasal cannula oxygen Cardiovascular status: stable and blood pressure returned to baseline Anesthetic complications: no       Last Vitals:  Vitals:   05/08/16 2000 05/08/16 2015  BP: 135/70 122/74  Pulse: (!) 56 61  Resp: 14 17  Temp:      Last Pain:  Vitals:   05/08/16 2015  TempSrc:   PainSc: 0-No pain                 Tiajuana Amass

## 2016-05-08 NOTE — Transfer of Care (Signed)
Immediate Anesthesia Transfer of Care Note  Patient: Sara Mendez  Procedure(s) Performed: Procedure(s): Left distal radius open reduction and internal fixation and repair as indicated (Left)  Patient Location: PACU  Anesthesia Type:MAC combined with regional for post-op pain  Level of Consciousness: awake, alert , oriented and patient cooperative  Airway & Oxygen Therapy: Patient Spontanous Breathing  Post-op Assessment: Report given to RN, Post -op Vital signs reviewed and stable and BLK seft arm  Post vital signs: Reviewed and stable  Last Vitals:  Vitals:   05/08/16 1743 05/08/16 1744  BP:    Pulse: 75 72  Resp:    Temp:      Last Pain:  Vitals:   05/08/16 1526  TempSrc:   PainSc: 9          Complications: No apparent anesthesia complications

## 2016-05-08 NOTE — H&P (Signed)
Sara Mendez is an 60 y.o. female.   Chief Complaint: LEFT DISTAL RADIUS FRACTURE HPI: PATIENT FELL 6 DAYS AGO INJURING LEFT WRIST. PATIENT IS IN SUGAR TONG SPLINT THAT WAS PUT ON IN THE EMERGENCY DEPARTMENT. PATIENT IS HERE TODAY FOR SURGERY.  PT WAS SEEN AND EVALUATED IN THE OFFICE YESTERDAY  Past Medical History:  Diagnosis Date  . Coronary artery disease   . Diverticulitis   . DJD (degenerative joint disease)   . Wears glasses     Past Surgical History:  Procedure Laterality Date  . ANKLE FRACTURE SURGERY  2008   lt  . BREAST SURGERY  2007   lt br bx  . GASTRIC BYPASS  10/13   has lost 100 lb  . KNEE ARTHROSCOPY  2008   left  . REPLACEMENT TOTAL KNEE  2011   right and left total knees in [place]  . TOOTH EXTRACTION Right 08/24/2012   Procedure: SURGICAL REMOVAL OF ODONTOGENIC KERATOCYST AND EXTRACT TEETH NUMBERS 30, AND 31;  Surgeon: Ceasar Mons, DDS;  Location: [facility];  Service: Oral Surgery;  Laterality: Right;    No family history on file. Social History:  reports that she has never smoked. She has never used smokeless tobacco. She reports that she does not drink alcohol or use drugs.  Allergies:  Allergies  Allergen Reactions  . Penicillins Rash    Has patient had a PCN reaction causing immediate rash, facial/tongue/throat swelling, SOB or lightheadedness with hypotension: No Has patient had a PCN reaction causing severe rash involving mucus membranes or skin necrosis: No Has patient had a PCN reaction that required hospitalization: No Has patient had a PCN reaction occurring within the last 10 years: No If all of the above answers are "NO", then may proceed with Cephalosporin use.     No prescriptions prior to admission.    No results found for this or any previous visit (from the past 48 hour(s)). No results found.  ROS NO RECENT ILLNESSES OR HOSPITALIZATIONS  There were no vitals taken for this visit. Physical Exam  General  Appearance:  Alert, cooperative, no distress, appears stated age  Head:  Normocephalic, without obvious abnormality, atraumatic  Eyes:  Pupils equal, conjunctiva/corneas clear,         Throat: Lips, mucosa, and tongue normal; teeth and gums normal  Neck: No visible masses     Lungs:   respirations unlabored  Chest Wall:  No tenderness or deformity  Heart:  Regular rate and rhythm,  Abdomen:   Soft, non-tender,         Extremities: LUE: BLOCK IN PLACE FINGERS WARM WELL PERFUSED SUGAR TONG SPLINT IN PLACE  Pulses: 2+ and symmetric  Skin: Skin color, texture, turgor normal, no rashes or lesions     Neurologic: Normal    Assessment LEFT DISTAL RADIUS FRACTURE, DISPLACED COMMINUTED, INTRAARTICULAR WITH ULNAR STYLOID FRACTURE  Plan OPEN REDUCTION AND INTERNAL FIXATION OF THE LEFT DISTAL RADIUS WITH REPAIR AS INDICATED  R/B/A DISCUSSED WITH PT IN OFFICE.  PT VOICED UNDERSTANDING OF PLAN CONSENT SIGNED DAY OF SURGERY PT SEEN AND EXAMINED PRIOR TO OPERATIVE PROCEDURE/DAY OF SURGERY SITE MARKED. QUESTIONS ANSWERED WILL BE ADMITTED OVERNIGHT OBSERVATION FOLLOWING SURGERY  WE ARE PLANNING SURGERY FOR YOUR UPPER EXTREMITY. THE RISKS AND BENEFITS OF SURGERY INCLUDE BUT NOT LIMITED TO BLEEDING INFECTION, DAMAGE TO NEARBY NERVES ARTERIES TENDONS, FAILURE OF SURGERY TO ACCOMPLISH ITS INTENDED GOALS, PERSISTENT SYMPTOMS AND NEED FOR FURTHER SURGICAL INTERVENTION. WITH THIS IN MIND WE  WILL PROCEED. I HAVE DISCUSSED WITH THE PATIENT THE PRE AND POSTOPERATIVE REGIMEN AND THE DOS AND DON'TS. PT VOICED UNDERSTANDING AND INFORMED CONSENT SIGNED.   Brynda Peon 05/08/2016, 1:05 PM

## 2016-05-08 NOTE — Discharge Instructions (Signed)
KEEP BANDAGE CLEAN AND DRY °CALL OFFICE FOR F/U APPT [phone] in 14 days °DR Kyriana Yankee CELL [phone] °KEEP HAND ELEVATED ABOVE HEART °OK TO APPLY ICE TO OPERATIVE AREA °CONTACT OFFICE IF ANY WORSENING PAIN OR CONCERNS. °

## 2016-05-09 ENCOUNTER — Encounter (HOSPITAL_COMMUNITY): Payer: Self-pay | Admitting: Orthopedic Surgery

## 2016-05-09 DIAGNOSIS — S52572A Other intraarticular fracture of lower end of left radius, initial encounter for closed fracture: Secondary | ICD-10-CM | POA: Diagnosis not present

## 2016-05-09 LAB — BASIC METABOLIC PANEL
Anion gap: 5 (ref 5–15)
BUN: 14 mg/dL (ref 6–20)
CALCIUM: 9.1 mg/dL (ref 8.9–10.3)
CO2: 24 mmol/L (ref 22–32)
CREATININE: 0.87 mg/dL (ref 0.44–1.00)
Chloride: 106 mmol/L (ref 101–111)
GFR calc non Af Amer: >60 mL/min (ref >60)
GLUCOSE: 247 mg/dL — AB (ref 65–99)
Potassium: 5 mmol/L (ref 3.5–5.1)
Sodium: 135 mmol/L (ref 135–145)

## 2016-05-09 NOTE — Progress Notes (Signed)
Pt ready for d/c per MD. NO PT/OT ordered, pt ambulating without any difficulties. Pain controlled with PO percocet and robaxin. Discharge instructions and prescriptions reviewed with pt, she denied questions. Pt waiting for transportation home.  [place], Jerry Caras

## 2016-05-09 NOTE — Progress Notes (Signed)
Orthopedic Tech Progress Note Patient Details:  Sara Mendez 05-13-1956 [id number]  Ortho Devices Type of Ortho Device: Arm sling Ortho Device/Splint Location: lue Ortho Device/Splint Interventions: Application   Becci Batty 05/09/2016, 7:52 AM

## 2016-05-09 NOTE — Progress Notes (Signed)
Orthopedic Tech Progress Note Patient Details:  Sara Mendez Feb 28, 1956 [id number]  Patient ID: Sara Mendez, female   DOB: 08-09-1955, 60 y.o.   MRN: [id number]   [name] 05/09/2016, 7:52 AM Viewed order from doctor's order list

## 2016-05-09 NOTE — Discharge Summary (Signed)
Physician Discharge Summary  Patient ID: Sara Mendez MRN: [id number] DOB/AGE: 1956/03/16 60 y.o.  Admit date: 05/08/2016 Discharge date: 05/09/16  Admission Diagnoses: Left distal radius fracture Past Medical History:  Diagnosis Date  . Coronary artery disease    patient denies this  . DJD (degenerative joint disease)   . Wears glasses     Discharge Diagnoses:  Active Problems:   Distal radius fracture, left   Surgeries: Procedure(s): Left distal radius open reduction and internal fixation and repair as indicated on 05/08/2016    Consultants:   Discharged Condition: Improved  Hospital Course: Sara Mendez is an 60 y.o. female who was admitted 05/08/2016 with a chief complaint of No chief complaint on file. , and found to have a diagnosis of Left distal radius fracture.  They were brought to the operating room on 05/08/2016 and underwent Procedure(s): Left distal radius open reduction and internal fixation and repair as indicated.    They were given perioperative antibiotics: Anti-infectives    Start     Dose/Rate Route Frequency Ordered Stop   05/09/16 0600  clindamycin (CLEOCIN) IVPB 900 mg     900 mg 100 mL/hr over 30 Minutes Intravenous On call to O.R. 05/08/16 1510 05/08/16 1806   05/09/16 0200  clindamycin (CLEOCIN) IVPB 600 mg     600 mg 100 mL/hr over 30 Minutes Intravenous  Once 05/08/16 2141 05/09/16 0135    .  They were given sequential compression devices, early ambulation, and Other (comment) - ambulation for DVT prophylaxis.  Recent vital signs: Patient Vitals for the past 24 hrs:  BP Temp Temp src Pulse Resp SpO2 Height Weight  05/09/16 0624 118/62 97.6 F (36.4 C) Oral (!) 58 17 100 % - -  05/08/16 2123 - - - - - 100 % - -  05/08/16 2048 134/78 97.4 F (36.3 C) Oral 70 17 100 % - -  05/08/16 2015 122/74 97.7 F (36.5 C) - 61 17 100 % - -  05/08/16 2000 135/70 - - (!) 56 14 100 % - -  05/08/16 1943 130/70 - - (!) 59 15 100 % - -  05/08/16  1928 133/74 - - 69 18 100 % - -  05/08/16 1913 123/89 97 F (36.1 C) - 74 16 99 % - -  05/08/16 1744 - - - 72 - 100 % - -  05/08/16 1743 - - - 75 - 100 % - -  05/08/16 1742 - - - 69 - 98 % - -  05/08/16 1741 - - - 69 - 100 % - -  05/08/16 1740 123/61 - - 62 - 100 % - -  05/08/16 1739 - - - 70 - 100 % - -  05/08/16 1738 - - - 69 - 100 % - -  05/08/16 1737 - - - 68 - 100 % - -  05/08/16 1736 - - - 66 - 100 % - -  05/08/16 1735 (!) 120/56 - - 64 - 100 % - -  05/08/16 1734 - - - 64 - 100 % - -  05/08/16 1733 - - - 65 - 100 % - -  05/08/16 1731 - - - 64 - 100 % - -  05/08/16 1730 (!) 125/58 - - 64 - 100 % - -  05/08/16 1452 131/65 98 F (36.7 C) Oral 60 18 100 % 5\' 5"  (1.651 m) 95.3 kg (210 lb)  .  Recent laboratory studies: No results found.  Discharge Medications:  Allergies as of 05/09/2016      Reactions   Penicillins Rash   Has patient had a PCN reaction causing immediate rash, facial/tongue/throat swelling, SOB or lightheadedness with hypotension: Yes Has patient had a PCN reaction causing severe rash involving mucus membranes or skin necrosis: No Has patient had a PCN reaction that required hospitalization: No Has patient had a PCN reaction occurring within the last 10 years: Yes If all of the above answers are "NO", then may proceed with Cephalosporin use.      Medication List    TAKE these medications   acetaminophen 500 MG tablet Commonly known as:  TYLENOL Take 500-1,000 mg by mouth every 6 (six) hours as needed for mild pain.   cholecalciferol 1000 units tablet Commonly known as:  VITAMIN D Take 1,000 Units by mouth at bedtime.   cyclobenzaprine 10 MG tablet Commonly known as:  FLEXERIL Take 1 tablet (10 mg total) by mouth 2 (two) times daily as needed for muscle spasms.   multivitamin with minerals tablet Take 1 tablet by mouth at bedtime.   oxyCODONE-acetaminophen 5-325 MG tablet Commonly known as:  PERCOCET Take 1-2 tablets by mouth every 4 (four)  hours as needed for severe pain.       Diagnostic Studies: Dg Forearm Left  Result Date: 05/02/2016 CLINICAL DATA:  Fall on outstretched hand with diffuse left wrist pain and deformity. EXAM: LEFT FOREARM - 2 VIEW COMPARISON:  None. FINDINGS: Comminuted distal radial fracture with intra-articular extension, transverse component, and apex volar angulation. With fracture plane extends into the distal radioulnar joint. Transverse fracture of the ulnar styloid likewise appears acute. No additional forearm fracture is identified. IMPRESSION: 1. Comminuted distal radial fracture with a transverse metaphyseal component and a longitudinal component extending through the distal fragment to the articular surface. 2. Transverse fracture of the ulnar styloid. Electronically Signed   By: Van Clines M.D.   On: 05/02/2016 15:06   Dg Wrist Complete Left  Result Date: 05/05/2016 CLINICAL DATA:  History of distal radius and ulnar styloid fractures due to a fall 05/02/2016. The patient reports her cast moved. EXAM: LEFT WRIST - COMPLETE 3+ VIEW COMPARISON:  Plain films left wrist 05/02/2016. FINDINGS: Comminuted intra-articular fracture of the distal radius with mild dorsal angulation and impaction appears unchanged. Ulnar styloid fracture is also unchanged in appearance. Soft tissue swelling is present about the wrist. IMPRESSION: No notable change in position or alignment of the patient's distal radius and ulnar styloid fractures. No new abnormality. Electronically Signed   By: Inge Rise M.D.   On: 05/05/2016 11:02   Dg Wrist Complete Left  Result Date: 05/02/2016 CLINICAL DATA:  Pain after fall EXAM: LEFT WRIST - COMPLETE 3+ VIEW COMPARISON:  05/02/2016 FINDINGS: Interim casting of the wrist which obscures bone detail. Comminuted intra-articular distal radius fracture, mildly impacted. Minimal dorsal angulation, slightly decreased. Comminuted ulnar styloid process fracture unchanged. IMPRESSION:  Interim casting of the wrist. Comminuted impacted intra-articular distal radius fracture with slight decreased dorsal angulation. Comminuted ulnar styloid process fracture. Electronically Signed   By: Donavan Foil M.D.   On: 05/02/2016 19:50    They benefited maximally from their hospital stay and there were no complications.     Disposition: 01-Home or [place], [place] In 2 weeks.   Specialty:  Orthopedic Surgery Why:  For suture removal Contact information: [address] [place] [address] [phone]  Signed: Brynda Peon 05/09/2016, 10:26 AM

## 2016-05-09 NOTE — Op Note (Signed)
NAMEORCHID, SCHMEISER NO.:  [id number]  MEDICAL RECORD NO.:  [id number]  LOCATION:  5N24C                        FACILITY:  [facility]  PHYSICIAN:  Linna Hoff IV, M.D.DATE OF BIRTH:  August 05, 1955  DATE OF PROCEDURE:  05/08/2016 DATE OF DISCHARGE:                              OPERATIVE REPORT   POSTOPERATIVE DIAGNOSIS:  Left wrist intra-articular distal radius fracture, 3 or more fragments.  POSTOPERATIVE DIAGNOSIS:  Left wrist intra-articular distal radius fracture, 3 or more fragments.  ATTENDING SURGEON:  Linna Hoff, M.D., who scrubbed and present for the entire procedure.  ASSISTANT SURGEON:  None.  ANESTHESIA:  Supraclavicular block with IV sedation.  SURGICAL PROCEDURES: 1. Left wrist open reduction and internal fixation of displaced intra-     articular distal radius fracture, 3 or more fragments. 2. Left wrist brachioradialis tendon release, forearm flexor. 3. Radiographs 3 views, left wrist.  RADIOGRAPHIC INTERPRETATION:  AP, lateral, and oblique views of the wrist did show the volar plate fixation in place.  The volar rim plate with a good alignment of the radial height, inclination, and tilt.  SURGICAL IMPLANTS:  DVR Crosslock volar rim plate.  SURGICAL INDICATIONS:  Ms. Rummel is a right-hand dominant female, who sustained a closed intra-articular distal radius fracture.  The patient was seen and evaluated in the office and recommended to undergo the above procedure.  Risks, benefits, and alternatives were discussed in detail with the patient and signed informed consent was obtained.  Risks include, but not limited to bleeding, infection; damage to nearby nerves, arteries, or tendons; loss of motion of the wrist and digits, incomplete relief of symptoms, nonunion, malunion hardware failure, need for further surgical intervention.  DESCRIPTION OF PROCEDURE:  The patient was properly identified in the preoperative holding area and marked  with a permanent marker on the left wrist to indicate the correct operative site.  The patient was then brought back to the operating room, placed supine on the anesthesia room table.  An IV sedation was administered.  The patient tolerated the block.  A well-padded tourniquet was placed on left brachium and sealed with 1000 drape.  The left upper extremity was then prepped and draped in normal sterile fashion.  Time-out was called, correct site was identified, and procedure then begun.  Attention was then turned to the left wrist.  A longitudinal incision was made directly over the FCR sheath.  Dissection was carried down through the skin and subcutaneous tissue.  Deep dissection carried at the FCR sheath where the FPL was swept out of the way.  An L-shaped pronator quadratus flap was then elevated to expose the fracture site.  The brachioradialis was then carefully tenotomized and released off the radial styloid, reducing the radial column and allowing for good inspection of the radial column, this was an intra-articular fracture, 3 or more fragments.  An open reduction was then achieved restoring the lunate, central, and radial columns.  The wound was thoroughly irrigated.  Fracture hematoma was evacuated.  The volar rim plate was then applied.  It was then held with a K-wire.  Once this was done, was confirmed using mini C-arm.  The oblong screw  hole was then placed proximally with appropriate drilling and depth gauge measurement done in the shaft.  Following this, distal fixation was then carried out with the locking pegs and screws. Multidirectional screw was used to obtain purchase in the radial styloid.  After distal fixation was carried out, fixation was then carried out with locking screws.  The wound was then irrigated.  The pronator quadratus was closed with 2-0 Vicryl, subcutaneous tissues with 4-0 Vicryl, and skin was closed with simple 4-0 Prolene sutures. Adaptic dressing,  sterile compressive bandage were applied.  The patient was placed in a well-padded sugar-tong splint, taken to the recovery room in good condition.  POSTPROCEDURAL PLAN:  The patient will be admitted overnight for IV antibiotics and pain control.  Discharged in the morning, seen back in the office in 2 weeks for wound check, suture removal, x-rays, application of short-arm cast, begin a therapy regimen at 4 week mark. I will put in a therapy order at the first postoperative visit.     Melrose Nakayama, M.D.     FWO/MEDQ  D:  05/08/2016  T:  05/09/2016  Job:  [id number]

## 2016-09-05 ENCOUNTER — Encounter: Payer: Self-pay | Admitting: Physical Medicine & Rehabilitation

## 2016-09-05 ENCOUNTER — Encounter
Payer: Worker's Compensation | Attending: Physical Medicine & Rehabilitation | Admitting: Physical Medicine & Rehabilitation

## 2016-09-05 VITALS — BP 115/70 | HR 53 | Resp 14

## 2016-09-05 DIAGNOSIS — R42 Dizziness and giddiness: Secondary | ICD-10-CM | POA: Diagnosis not present

## 2016-09-05 DIAGNOSIS — M791 Myalgia, unspecified site: Secondary | ICD-10-CM

## 2016-09-05 DIAGNOSIS — F0781 Postconcussional syndrome: Secondary | ICD-10-CM | POA: Diagnosis not present

## 2016-09-05 DIAGNOSIS — G44309 Post-traumatic headache, unspecified, not intractable: Secondary | ICD-10-CM

## 2016-09-05 DIAGNOSIS — R51 Headache: Secondary | ICD-10-CM | POA: Insufficient documentation

## 2016-09-05 DIAGNOSIS — G479 Sleep disorder, unspecified: Secondary | ICD-10-CM | POA: Diagnosis not present

## 2016-09-05 DIAGNOSIS — G4730 Sleep apnea, unspecified: Secondary | ICD-10-CM | POA: Diagnosis not present

## 2016-09-05 DIAGNOSIS — S0990XS Unspecified injury of head, sequela: Secondary | ICD-10-CM

## 2016-09-05 MED ORDER — AMITRIPTYLINE HCL 10 MG PO TABS: 10.0000 mg | ORAL_TABLET | Freq: Every day | ORAL | 1 refills | Status: DC

## 2016-09-05 MED ORDER — LIDOCAINE 5 % EX PTCH: 1.0000 | MEDICATED_PATCH | CUTANEOUS | 0 refills | Status: DC

## 2016-09-05 MED ORDER — METHOCARBAMOL 500 MG PO TABS: 500.0000 mg | ORAL_TABLET | Freq: Three times a day (TID) | ORAL | 1 refills | Status: DC | PRN

## 2016-09-05 NOTE — Progress Notes (Signed)
Subjective:    Patient ID: Sara Mendez, female    DOB: June 10, 1955, 61 y.o.   MRN: [id number]  HPI 61 y/o female with pmh/psh left ankle fracture, right knee TKR, right rotator cuff repair, ORIF left radius due to work injury presents with headaches. Located frontal head.  Started ~06/2016. Injury 12/21/117. Initial injury was at work where she got foot caught in rug and fell hitting head against the wall.  Denies LOC, but notes being "dazed". She is not on blood thinners.  Her work is mainly sedentary, spending about 5 hours looking at a computer.  Inactivity improves the pain. Lights, noise exacerbate the pain. Sharp.  Intermittent.  Associated dizziness. Denies falls. Pain limits from watching TV and doing computer work.    Pain Inventory Average Pain 7 Pain Right Now 7 My pain is intermittent and sharp  In the last 24 hours, has pain interfered with the following? General activity 6 Relation with others 5 Enjoyment of life 6 What TIME of day is your pain at its worst? varies Sleep (in general) Fair  Pain is worse with: walking, inactivity and some activites Pain improves with: rest and medication Relief from Meds: 5  Mobility walk without assistance ability to climb steps?  yes do you drive?  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 No pertinent family history of head trauma.  Social History   Social History  . Marital status: Divorced    Spouse name: N/A  . Number of children: N/A  . Years of education: N/A   Social History Main Topics  . Smoking status: Never Smoker  . Smokeless tobacco: Never Used  . Alcohol use No  . Drug use: No  . Sexual activity: Not Asked   Other Topics Concern  . None   Social History Narrative  . None   Past Surgical History:  Procedure Laterality Date  .  ANKLE FRACTURE SURGERY  2008   lt  . BREAST SURGERY  2007   lt br bx  . GASTRIC BYPASS  10/13   has lost 100 lb  . KNEE ARTHROSCOPY  2008   left  . OPEN REDUCTION INTERNAL FIXATION (ORIF) DISTAL RADIAL FRACTURE Left 05/08/2016   Procedure: Left distal radius open reduction and internal fixation and repair as indicated;  Surgeon: Iran Planas, MD;  Location: [place];  Service: Orthopedics;  Laterality: Left;  . REPLACEMENT TOTAL KNEE  2011   right and left total knees in [place]  . TOOTH EXTRACTION Right 08/24/2012   Procedure: SURGICAL REMOVAL OF ODONTOGENIC KERATOCYST AND EXTRACT TEETH NUMBERS 30, AND 31;  Surgeon: Ceasar Mons, DDS;  Location: [place];  Service: Oral Surgery;  Laterality: Right;   Past Medical History:  Diagnosis Date  . Coronary artery disease    patient denies this  . DJD (degenerative joint disease)   . Wears glasses    BP 115/70   Pulse (!) 53   Resp 14   SpO2 96%   Opioid Risk Score:   Fall Risk Score:  `1  Depression screen PHQ 2/9  Depression screen PHQ 2/9 09/05/2016  Decreased Interest 0  Down, Depressed, Hopeless 0  PHQ - 2 Score 0  Altered sleeping 1  Tired, decreased energy 1  Change in appetite 0  Feeling bad or failure about  yourself  0  Trouble concentrating 0  Moving slowly or fidgety/restless 0  Suicidal thoughts 0  PHQ-9 Score 2  Difficult doing work/chores Not difficult at all    Review of Systems  Constitutional: Negative.   HENT: Negative.   Eyes: Negative.   Respiratory: Negative.   Cardiovascular: Negative.   Gastrointestinal: Negative.   Endocrine: Negative.   Genitourinary: Negative.   Musculoskeletal: Positive for arthralgias, gait problem and myalgias.  Skin: Negative.   Allergic/Immunologic: Negative.   Neurological: Positive for dizziness and headaches.  Hematological: Negative.   Psychiatric/Behavioral: Negative.   All other systems reviewed and are negative.     Objective:   Physical  Exam Gen: NAD. Vital signs reviewed HENT: Normocephalic, Atraumatic Eyes: EOMI. No discharge.  Cardio: RRR. No JVD. Pulm: B/l clear to auscultation.  Effort normal.  Abd: Soft, BS+ MSK:  Gait antalgic.   TTP frontal head, posterior neck, and traps.   Neuro: CN II-XII grossly intact.    Strength  Limited due to sling Skin: Warm and Dry. Intact.     Assessment & Plan:  61 y/o female with pmh/psh left ankle fracture, right knee TKR, right rotator cuff repair, ORIF left radius due to work injury presents with headaches.   1. Headaches - Multifactorial - posterior and anterior, likely different, but overlapping etiologies  Cervicalgia + post concussive syndrome + Sling  No imaging on file for head, will consider head CT in future if necessary  Labs reviewed  Referral information reviewed  [facility] reviewed - Ortho providing Oxycodone  Cont Heat  Encouraged low stim environment, use of sunglasses when in son, limited computer, TV use  Will order Lidoderm patch - for frontal headache  Will consider Topamax 25 daily  Will consider Botox in future as well   2.Myalgia  Confounded by sling, which has to be main  Will schedule for trigger point injections  Will order Robaxin 500 TID PRN  3. Sleep disturbance  Will order Elavil 10mg  qHS

## 2016-09-12 ENCOUNTER — Encounter
Payer: Worker's Compensation | Attending: Physical Medicine & Rehabilitation | Admitting: Physical Medicine & Rehabilitation

## 2016-09-12 ENCOUNTER — Encounter: Payer: Self-pay | Admitting: Physical Medicine & Rehabilitation

## 2016-09-12 ENCOUNTER — Telehealth: Payer: Self-pay

## 2016-09-12 VITALS — BP 115/75 | HR 55 | Resp 14

## 2016-09-12 DIAGNOSIS — G4730 Sleep apnea, unspecified: Secondary | ICD-10-CM | POA: Diagnosis not present

## 2016-09-12 DIAGNOSIS — R51 Headache: Secondary | ICD-10-CM | POA: Diagnosis present

## 2016-09-12 DIAGNOSIS — R42 Dizziness and giddiness: Secondary | ICD-10-CM | POA: Diagnosis not present

## 2016-09-12 DIAGNOSIS — M791 Myalgia, unspecified site: Secondary | ICD-10-CM

## 2016-09-12 NOTE — Patient Instructions (Signed)
Patient to stay out of work until next appointment

## 2016-09-12 NOTE — Progress Notes (Signed)
Trigger point injection procedure note: Trigger Point Injection: Written consent was obtained for the patient. Trigger points were identified of bilateral cervical paraspinals and trapezii muscles. The areas were cleaned with alcohol, and each of  these trigger points were injected with 1 cc of 0.5% Marcaine. Needle draw back was performed. There were no complications from the procedure, and it was well tolerated.

## 2016-09-12 NOTE — Telephone Encounter (Signed)
error 

## 2016-09-12 NOTE — Progress Notes (Deleted)
   Subjective:    Patient ID: Sara Mendez, female    DOB: 05/18/1955, 61 y.o.   MRN: [id number]  HPI   Pain Inventory Average Pain {NUMBERS; 0-10:5044} Pain Right Now {NUMBERS; 0-10:5044} My pain is {PAIN DESCRIPTION:21022940}  In the last 24 hours, has pain interfered with the following? General activity {NUMBERS; 0-10:5044} Relation with others {NUMBERS; 0-10:5044} Enjoyment of life {NUMBERS; 0-10:5044} What TIME of day is your pain at its worst? {TIME OF WUJ:81191478} Sleep (in general) {BHH GOOD/FAIR/POOR:22877}  Pain is worse with: {ACTIVITIES:21022942} Pain improves with: {PAIN IMPROVES GNFA:21308657} Relief from Meds: {NUMBERS; 0-10:5044}  Mobility {MOBILITY QIO:96295284}  Function {FUNCTION:21022946}  Neuro/Psych {NEURO/PSYCH:21022948}  Prior Studies {CPRM PRIOR STUDIES:21022953}  Physicians involved in your care {CPRM PHYSICIANS INVOLVED IN YOUR CARE:21022954}   No family history on file. Social History   Social History  . Marital status: Divorced    Spouse name: N/A  . Number of children: N/A  . Years of education: N/A   Social History Main Topics  . Smoking status: Never Smoker  . Smokeless tobacco: Never Used  . Alcohol use No  . Drug use: No  . Sexual activity: Not on file   Other Topics Concern  . Not on file   Social History Narrative  . No narrative on file   Past Surgical History:  Procedure Laterality Date  . ANKLE FRACTURE SURGERY  2008   lt  . BREAST SURGERY  2007   lt br bx  . GASTRIC BYPASS  10/13   has lost 100 lb  . KNEE ARTHROSCOPY  2008   left  . OPEN REDUCTION INTERNAL FIXATION (ORIF) DISTAL RADIAL FRACTURE Left 05/08/2016   Procedure: Left distal radius open reduction and internal fixation and repair as indicated;  Surgeon: Iran Planas, MD;  Location: [place];  Service: Orthopedics;  Laterality: Left;  . REPLACEMENT TOTAL KNEE  2011   right and left total knees in [place]  . TOOTH EXTRACTION Right 08/24/2012   Procedure: SURGICAL REMOVAL OF ODONTOGENIC KERATOCYST AND EXTRACT TEETH NUMBERS 30, AND 31;  Surgeon: Ceasar Mons, DDS;  Location: [place];  Service: Oral Surgery;  Laterality: Right;   Past Medical History:  Diagnosis Date  . Coronary artery disease    patient denies this  . DJD (degenerative joint disease)   . Wears glasses    There were no vitals taken for this visit.  Opioid Risk Score:   Fall Risk Score:  `1  Depression screen PHQ 2/9  Depression screen PHQ 2/9 09/05/2016  Decreased Interest 0  Down, Depressed, Hopeless 0  PHQ - 2 Score 0  Altered sleeping 1  Tired, decreased energy 1  Change in appetite 0  Feeling bad or failure about yourself  0  Trouble concentrating 0  Moving slowly or fidgety/restless 0  Suicidal thoughts 0  PHQ-9 Score 2  Difficult doing work/chores Not difficult at all    Review of Systems     Objective:   Physical Exam        Assessment & Plan:

## 2016-09-19 ENCOUNTER — Encounter: Payer: Self-pay | Admitting: Physical Medicine & Rehabilitation

## 2016-09-19 ENCOUNTER — Encounter
Payer: Worker's Compensation | Attending: Physical Medicine & Rehabilitation | Admitting: Physical Medicine & Rehabilitation

## 2016-09-19 VITALS — BP 123/77 | HR 52

## 2016-09-19 DIAGNOSIS — M791 Myalgia, unspecified site: Secondary | ICD-10-CM

## 2016-09-19 NOTE — Progress Notes (Signed)
Trigger point injection procedure note: Trigger Point Injection: Written consent was obtained for the patient. Trigger points were identified of bilateral cervical paraspinals and trapezii muscles. The areas were cleaned with alcohol, and each of  these trigger points were injected with 1 cc of 0.5% Marcaine (x5). Needle draw back was performed. There were no complications from the procedure, and it was well tolerated.

## 2016-09-19 NOTE — Patient Instructions (Signed)
Patient to stay out of work until next appointment

## 2016-10-03 ENCOUNTER — Encounter
Payer: Worker's Compensation | Attending: Physical Medicine & Rehabilitation | Admitting: Physical Medicine & Rehabilitation

## 2016-10-03 ENCOUNTER — Encounter: Payer: Self-pay | Admitting: Physical Medicine & Rehabilitation

## 2016-10-03 VITALS — BP 111/75 | HR 56 | Resp 14

## 2016-10-03 DIAGNOSIS — G44309 Post-traumatic headache, unspecified, not intractable: Secondary | ICD-10-CM

## 2016-10-03 DIAGNOSIS — G894 Chronic pain syndrome: Secondary | ICD-10-CM | POA: Insufficient documentation

## 2016-10-03 DIAGNOSIS — Z9889 Other specified postprocedural states: Secondary | ICD-10-CM | POA: Insufficient documentation

## 2016-10-03 DIAGNOSIS — F0781 Postconcussional syndrome: Secondary | ICD-10-CM | POA: Diagnosis not present

## 2016-10-03 DIAGNOSIS — S82892A Other fracture of left lower leg, initial encounter for closed fracture: Secondary | ICD-10-CM | POA: Diagnosis not present

## 2016-10-03 DIAGNOSIS — G479 Sleep disorder, unspecified: Secondary | ICD-10-CM | POA: Diagnosis not present

## 2016-10-03 DIAGNOSIS — R51 Headache: Secondary | ICD-10-CM | POA: Insufficient documentation

## 2016-10-03 DIAGNOSIS — R42 Dizziness and giddiness: Secondary | ICD-10-CM | POA: Diagnosis not present

## 2016-10-03 DIAGNOSIS — M791 Myalgia, unspecified site: Secondary | ICD-10-CM

## 2016-10-03 DIAGNOSIS — S0990XS Unspecified injury of head, sequela: Secondary | ICD-10-CM

## 2016-10-03 DIAGNOSIS — M542 Cervicalgia: Secondary | ICD-10-CM | POA: Insufficient documentation

## 2016-10-03 MED ORDER — METHOCARBAMOL 750 MG PO TABS: 750.0000 mg | ORAL_TABLET | Freq: Three times a day (TID) | ORAL | 1 refills | Status: DC | PRN

## 2016-10-03 MED ORDER — DICLOFENAC SODIUM 1 % TD GEL: 2.0000 g | Freq: Four times a day (QID) | TRANSDERMAL | 1 refills | Status: DC

## 2016-10-03 MED ORDER — TOPIRAMATE 50 MG PO TABS: 25.0000 mg | ORAL_TABLET | Freq: Every day | ORAL | 1 refills | Status: DC

## 2016-10-03 MED ORDER — AMITRIPTYLINE HCL 10 MG PO TABS: 10.0000 mg | ORAL_TABLET | Freq: Every day | ORAL | 1 refills | Status: DC

## 2016-10-03 MED ORDER — LIDOCAINE 5 % EX PTCH: 1.0000 | MEDICATED_PATCH | CUTANEOUS | 0 refills | Status: DC

## 2016-10-03 NOTE — Progress Notes (Signed)
Subjective:    Patient ID: Sara Mendez, female    DOB: 06-22-1955, 61 y.o.   MRN: [id number]  HPI 61 y/o female with pmh/psh left ankle fracture, right knee TKR, right rotator cuff repair, ORIF left radius due to work injury presents for follow up for headaches.  Initially stated: Located frontal head.  Started ~06/2016. Injury 12/21/117. Initial injury was at work where she got foot caught in rug and fell hitting head against the wall.  Denies LOC, but notes being "dazed". She is not on blood thinners.  Her work is mainly sedentary, spending about 5 hours looking at a computer.  Inactivity improves the pain. Lights, noise exacerbate the pain. Sharp.  Intermittent.  Associated dizziness. Denies falls. Pain limits from watching TV and doing computer work.    Last clinic visit 09/19/16.  At that time she received trigger point injections.  She states she had benefit, but the last set only lasted 4-5 days. She has improvement with low-stim environment. Lidoderm patches help.  The Robaxin helps as well.  The elavil helps with sleep. Overall, she states it is better, but today has more pain.   Pain Inventory Average Pain 6 Pain Right Now 8 My pain is sharp, stabbing and aching  In the last 24 hours, has pain interfered with the following? General activity 6 Relation with others 6 Enjoyment of life 8 What TIME of day is your pain at its worst? morning, daytime Sleep (in general) Fair  Pain is worse with: bending and some activites Pain improves with: rest and medication Relief from Meds: 1  Mobility walk without assistance ability to climb steps?  yes do you drive?  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  no  Physicians involved in your care Any changes since last visit?  no   History reviewed. No pertinent family history of head trauma.  Social History   Social  History  . Marital status: Divorced    Spouse name: N/A  . Number of children: N/A  . Years of education: N/A   Social History Main Topics  . Smoking status: Never Smoker  . Smokeless tobacco: Never Used  . Alcohol use No  . Drug use: No  . Sexual activity: Not Asked   Other Topics Concern  . None   Social History Narrative  . None   Past Surgical History:  Procedure Laterality Date  . ANKLE FRACTURE SURGERY  2008   lt  . BREAST SURGERY  2007   lt br bx  . GASTRIC BYPASS  10/13   has lost 100 lb  . KNEE ARTHROSCOPY  2008   left  . OPEN REDUCTION INTERNAL FIXATION (ORIF) DISTAL RADIAL FRACTURE Left 05/08/2016   Procedure: Left distal radius open reduction and internal fixation and repair as indicated;  Surgeon: Iran Planas, MD;  Location: [place];  Service: Orthopedics;  Laterality: Left;  . REPLACEMENT TOTAL KNEE  2011   right and left total knees in [place]  . TOOTH EXTRACTION Right 08/24/2012   Procedure: SURGICAL REMOVAL OF ODONTOGENIC KERATOCYST AND EXTRACT TEETH NUMBERS 30, AND 31;  Surgeon: Ceasar Mons, DDS;  Location: [place];  Service: Oral Surgery;  Laterality: Right;   Past Medical History:  Diagnosis Date  . Coronary artery disease    patient denies this  . DJD (degenerative joint disease)   . Wears glasses  BP 111/75 (BP Location: Left Arm, Patient Position: Sitting, Cuff Size: Large)   Pulse (!) 56   Resp 14   SpO2 98%   Opioid Risk Score:   Fall Risk Score:  `1  Depression screen PHQ 2/9  Depression screen PHQ 2/9 09/05/2016  Decreased Interest 0  Down, Depressed, Hopeless 0  PHQ - 2 Score 0  Altered sleeping 1  Tired, decreased energy 1  Change in appetite 0  Feeling bad or failure about yourself  0  Trouble concentrating 0  Moving slowly or fidgety/restless 0  Suicidal thoughts 0  PHQ-9 Score 2  Difficult doing work/chores Not difficult at all    Review of Systems  Constitutional: Negative.   HENT: Negative.    Eyes: Negative.   Respiratory: Negative.   Cardiovascular: Negative.   Gastrointestinal: Negative.   Endocrine: Negative.   Genitourinary: Negative.   Musculoskeletal: Positive for arthralgias, gait problem and myalgias.  Skin: Negative.   Allergic/Immunologic: Negative.   Neurological: Positive for dizziness and headaches.  Hematological: Negative.   Psychiatric/Behavioral: Negative.   All other systems reviewed and are negative.     Objective:   Physical Exam Gen: NAD. Vital signs reviewed HENT: Normocephalic, Atraumatic Eyes: EOMI. No discharge.  Cardio: RRR. No JVD. Pulm: B/l clear to auscultation.  Effort normal. Abd: Soft, BS+ MSK:  Gait antalgic.   +TTP frontal head, posterior neck, and traps R>L.   Neuro: CN II-XII grossly intact.    Strength  4/5 b/l UE throughout (pain inhibition) Skin: Warm and Dry. Intact.     Assessment & Plan:  61 y/o female with pmh/psh left ankle fracture, right knee TKR, right rotator cuff repair, ORIF left radius due to work injury presents for follow up of headaches.   1. Headaches - Multifactorial - posterior and anterior, likely different, but overlapping etiologies  Cervicalgia + post concussive syndrome  Cont Heat  Cont low stim environment, use of sunglasses when in son, limited computer, TV use  Cont Lidoderm patch - for frontal headache  Will order Topamax 25 daily, consider increase to 50 after 1 week if no side effects  Will consider Botox in future as well  Will not use Propranolol due to low HR   2.Myalgia  Trigger point injections with temporary benefit  Will increase Robaxin to 750 TID PRN  Voltaren gel ordered  3. Sleep disturbance  Cont Elavil 10mg  qHS

## 2016-10-03 NOTE — Patient Instructions (Signed)
Patient not to return to work until follow up appointment 

## 2016-10-31 ENCOUNTER — Encounter
Payer: Worker's Compensation | Attending: Physical Medicine & Rehabilitation | Admitting: Physical Medicine & Rehabilitation

## 2016-10-31 ENCOUNTER — Encounter: Payer: Self-pay | Admitting: Physical Medicine & Rehabilitation

## 2016-10-31 VITALS — BP 100/68 | HR 61

## 2016-10-31 DIAGNOSIS — G44309 Post-traumatic headache, unspecified, not intractable: Secondary | ICD-10-CM

## 2016-10-31 DIAGNOSIS — F0781 Postconcussional syndrome: Secondary | ICD-10-CM | POA: Diagnosis not present

## 2016-10-31 DIAGNOSIS — M791 Myalgia, unspecified site: Secondary | ICD-10-CM

## 2016-10-31 DIAGNOSIS — M542 Cervicalgia: Secondary | ICD-10-CM | POA: Insufficient documentation

## 2016-10-31 DIAGNOSIS — G479 Sleep disorder, unspecified: Secondary | ICD-10-CM | POA: Insufficient documentation

## 2016-10-31 DIAGNOSIS — R51 Headache: Secondary | ICD-10-CM | POA: Diagnosis present

## 2016-10-31 DIAGNOSIS — R42 Dizziness and giddiness: Secondary | ICD-10-CM | POA: Diagnosis not present

## 2016-10-31 DIAGNOSIS — S0990XS Unspecified injury of head, sequela: Secondary | ICD-10-CM | POA: Diagnosis not present

## 2016-10-31 MED ORDER — TOPIRAMATE 50 MG PO TABS: 50.0000 mg | ORAL_TABLET | Freq: Two times a day (BID) | ORAL | 1 refills | Status: DC

## 2016-10-31 MED ORDER — AMITRIPTYLINE HCL 25 MG PO TABS: 25.0000 mg | ORAL_TABLET | Freq: Every day | ORAL | 1 refills | Status: DC

## 2016-10-31 MED ORDER — DICLOFENAC SODIUM 1 % TD GEL: 2.0000 g | Freq: Four times a day (QID) | TRANSDERMAL | 1 refills | Status: AC

## 2016-10-31 MED ORDER — LIDOCAINE 5 % EX PTCH: 1.0000 | MEDICATED_PATCH | CUTANEOUS | 0 refills | Status: DC

## 2016-10-31 NOTE — Progress Notes (Signed)
Subjective:    Patient ID: Sara Mendez, female    DOB: [birth year], 61 y.o.   MRN: [id number]  HPI 60 y/o female with pmh/psh left ankle fracture, right knee TKR, right rotator cuff repair, ORIF left radius due to work injury presents for follow up for headaches.  Initially stated: Located frontal head.  Started ~06/2016. Injury 12/21/117. Initial injury was at work where she got foot caught in rug and fell hitting head against the wall.  Denies LOC, but notes being "dazed". She is not on blood thinners.  Her work is mainly sedentary, spending about 5 hours looking at a computer.  Inactivity improves the pain. Lights, noise exacerbate the pain. Sharp.  Intermittent.  Associated dizziness. Denies falls. Pain limits from watching TV and doing computer work.    Last clinic visit 10/03/16.  Since last visit, she states she continues to have difficulty with headaches with environmental stimuli (computers, TV, etc).  Lidoderm patches provide good benefit.  She is currently taking 50 qhs.  She continues to use amytriptaline with some benefit.  Voltaren gel and Robaxin helps.  She continues to have headaches, but they are not as sharp.  She states her sleep has gotten worse.   Pain Inventory Average Pain 6 Pain Right Now 8 My pain is sharp, stabbing and aching  In the last 24 hours, has pain interfered with the following? General activity 6 Relation with others 6 Enjoyment of life 8 What TIME of day is your pain at its worst? morning, daytime Sleep (in general) Fair  Pain is worse with: bending and some activites Pain improves with: rest and medication Relief from Meds: 1  Mobility walk without assistance ability to climb steps?  yes do you drive?  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  no  Physicians involved in your care Any changes since last visit?  no   History  reviewed. No pertinent family history of head trauma.  Social History   Social History  . Marital status: Divorced    Spouse name: N/A  . Number of children: N/A  . Years of education: N/A   Social History Main Topics  . Smoking status: Never Smoker  . Smokeless tobacco: Never Used  . Alcohol use No  . Drug use: No  . Sexual activity: Not Asked   Other Topics Concern  . None   Social History Narrative  . None   Past Surgical History:  Procedure Laterality Date  . ANKLE FRACTURE SURGERY  2008   lt  . BREAST SURGERY  2007   lt br bx  . GASTRIC BYPASS  10/13   has lost 100 lb  . KNEE ARTHROSCOPY  2008   left  . OPEN REDUCTION INTERNAL FIXATION (ORIF) DISTAL RADIAL FRACTURE Left 05/08/2016   Procedure: Left distal radius open reduction and internal fixation and repair as indicated;  Surgeon: Iran Planas, MD;  Location: [place];  Service: Orthopedics;  Laterality: Left;  . REPLACEMENT TOTAL KNEE  2011   right and left total knees in [place]  . TOOTH EXTRACTION Right 08/24/2012   Procedure: SURGICAL REMOVAL OF ODONTOGENIC KERATOCYST AND EXTRACT TEETH NUMBERS 30, AND 31;  Surgeon: Ceasar Mons, DDS;  Location: [place];  Service: Oral Surgery;  Laterality: Right;   Past Medical History:  Diagnosis Date  . Coronary artery disease    patient denies  this  . DJD (degenerative joint disease)   . Wears glasses    BP 100/68   Pulse 61   SpO2 98%   Opioid Risk Score:   Fall Risk Score:  `1  Depression screen PHQ 2/9  Depression screen PHQ 2/9 09/05/2016  Decreased Interest 0  Down, Depressed, Hopeless 0  PHQ - 2 Score 0  Altered sleeping 1  Tired, decreased energy 1  Change in appetite 0  Feeling bad or failure about yourself  0  Trouble concentrating 0  Moving slowly or fidgety/restless 0  Suicidal thoughts 0  PHQ-9 Score 2  Difficult doing work/chores Not difficult at all    Review of Systems  Constitutional: Negative.   HENT: Negative.     Eyes: Negative.   Respiratory: Negative.   Cardiovascular: Negative.   Gastrointestinal: Negative.   Endocrine: Negative.   Genitourinary: Negative.   Musculoskeletal: Positive for arthralgias, gait problem and myalgias.  Skin: Negative.   Allergic/Immunologic: Negative.   Neurological: Positive for dizziness and headaches.  Hematological: Negative.   Psychiatric/Behavioral: Negative.   All other systems reviewed and are negative.     Objective:   Physical Exam Gen: NAD. Vital signs reviewed HENT: Normocephalic, Atraumatic Eyes: EOMI. No discharge.  Cardio: RRR. No JVD. Pulm: B/l clear to auscultation.  Effort normal. Abd: Soft, BS+ MSK:  Gait antalgic.   +posterior neck, and traps R>L.    No TTP frontal head  Neuro:   Strength  4-4+/5 b/l UE throughout (pain inhibition) Skin: Warm and Dry. Intact.     Assessment & Plan:  61 y/o female with pmh/psh left ankle fracture, right knee TKR, right rotator cuff repair, ORIF left radius due to work injury presents for follow up of headaches.   1. Headaches - Multifactorial - posterior and anterior, likely different, but overlapping etiologies  Cervicalgia + post concussive syndrome + sleep disturbance  Cont Heat  Cont low stim environment, use of sunglasses when in son, limited computer, TV use  Cont Lidoderm patch - for frontal headache  Will increase Topamax to 50 BID, start with 50qhs (current dose) and 25 AM, increased to 50 BID after 1 week   Will consider Botox in future as well  Will not use Propranolol due to low HR  Patient received regular eye exams  Cont good water intake  Pt improving with increase in functioning, however, pain is also aggravated with functional improvement   2.Myalgia  Trigger point injections with temporary benefit  Cont Robaxin 750 TID PRN  Cont Voltaren gel   3. Sleep disturbance  Elavil increased to 25mg  qHS

## 2016-10-31 NOTE — Patient Instructions (Signed)
Patient not to return to work until follow up visit

## 2016-12-04 ENCOUNTER — Encounter
Payer: Worker's Compensation | Attending: Physical Medicine & Rehabilitation | Admitting: Physical Medicine & Rehabilitation

## 2016-12-04 ENCOUNTER — Encounter: Payer: Self-pay | Admitting: Physical Medicine & Rehabilitation

## 2016-12-04 VITALS — BP 120/80 | HR 56

## 2016-12-04 DIAGNOSIS — M1990 Unspecified osteoarthritis, unspecified site: Secondary | ICD-10-CM | POA: Diagnosis not present

## 2016-12-04 DIAGNOSIS — Z96653 Presence of artificial knee joint, bilateral: Secondary | ICD-10-CM | POA: Diagnosis not present

## 2016-12-04 DIAGNOSIS — I2510 Atherosclerotic heart disease of native coronary artery without angina pectoris: Secondary | ICD-10-CM | POA: Diagnosis not present

## 2016-12-04 DIAGNOSIS — F410 Panic disorder [episodic paroxysmal anxiety] without agoraphobia: Secondary | ICD-10-CM | POA: Diagnosis not present

## 2016-12-04 DIAGNOSIS — F0781 Postconcussional syndrome: Secondary | ICD-10-CM | POA: Insufficient documentation

## 2016-12-04 DIAGNOSIS — M542 Cervicalgia: Secondary | ICD-10-CM | POA: Diagnosis not present

## 2016-12-04 DIAGNOSIS — F411 Generalized anxiety disorder: Secondary | ICD-10-CM | POA: Diagnosis not present

## 2016-12-04 DIAGNOSIS — R51 Headache: Secondary | ICD-10-CM | POA: Diagnosis present

## 2016-12-04 DIAGNOSIS — G44309 Post-traumatic headache, unspecified, not intractable: Secondary | ICD-10-CM | POA: Diagnosis not present

## 2016-12-04 DIAGNOSIS — S0990XS Unspecified injury of head, sequela: Secondary | ICD-10-CM

## 2016-12-04 DIAGNOSIS — G479 Sleep disorder, unspecified: Secondary | ICD-10-CM | POA: Diagnosis not present

## 2016-12-04 DIAGNOSIS — M791 Myalgia, unspecified site: Secondary | ICD-10-CM

## 2016-12-04 MED ORDER — TOPIRAMATE 50 MG PO TABS: 50.0000 mg | ORAL_TABLET | Freq: Two times a day (BID) | ORAL | 1 refills | Status: DC

## 2016-12-04 MED ORDER — DIAZEPAM 2 MG PO TABS: 2.0000 mg | ORAL_TABLET | Freq: Three times a day (TID) | ORAL | 0 refills | Status: AC | PRN

## 2016-12-04 MED ORDER — NORTRIPTYLINE HCL 25 MG PO CAPS: 25.0000 mg | ORAL_CAPSULE | Freq: Every day | ORAL | 1 refills | Status: DC

## 2016-12-04 MED ORDER — LIDOCAINE 5 % EX PTCH: 1.0000 | MEDICATED_PATCH | CUTANEOUS | 0 refills | Status: DC

## 2016-12-04 NOTE — Patient Instructions (Signed)
Pt not to return to work until follow up appointment 

## 2016-12-04 NOTE — Progress Notes (Signed)
Subjective:    Patient ID: Sara Mendez, female    DOB: 01/25/1956, 61 y.o.   MRN: [id number]  HPI 61 y/o female with pmh/psh left ankle fracture, right knee TKR, right rotator cuff repair, ORIF left radius due to work injury presents for follow up for headaches.  Initially stated: Located frontal head.  Started ~06/2016. Injury 12/21/117. Initial injury was at work where she got foot caught in rug and fell hitting head against the wall.  Denies LOC, but notes being "dazed". She is not on blood thinners.  Her work is mainly sedentary, spending about 5 hours looking at a computer.  Inactivity improves the pain. Lights, noise exacerbate the pain. Sharp.  Intermittent.  Associated dizziness. Denies falls. Pain limits from watching TV and doing computer work.    Last clinic visit 10/03/16.     61 y/o female with pmh/psh left ankle fracture, right knee TKR, right rotator cuff repair, ORIF left radius due to work injury presents for follow up of headaches.   Last clinic visit 10/31/16.  Since that time, she states her headaches are a little worse.  Her son did get married in the last few weeks.  She continues low stim environment.  She continues to have pain when using computer.  She is on Topomax 50 qhs and 25 AM.  Her sleep is fair.  Elavil helps.  She notes panick attack since last visit as well.   Pain Inventory Average Pain 6 Pain Right Now 6 My pain is sharp  In the last 24 hours, has pain interfered with the following? General activity 0 Relation with others 0 Enjoyment of life 0 What TIME of day is your pain at its worst? no pain, daytime Sleep (in general) Fair  Pain is worse with: walking, standing and some activites Pain improves with: rest and medication Relief from Meds: 2  Mobility walk without assistance  Function employed # of hrs/week 40  Neuro/Psych dizziness anxiety  Prior Studies Any changes since last visit?  no  Physicians involved in your care Any changes  since last visit?  no   History reviewed. No pertinent family history of head trauma.  Social History   Social History  . Marital status: Divorced    Spouse name: N/A  . Number of children: N/A  . Years of education: N/A   Social History Main Topics  . Smoking status: Never Smoker  . Smokeless tobacco: Never Used  . Alcohol use No  . Drug use: No  . Sexual activity: Not Asked   Other Topics Concern  . None   Social History Narrative  . None   Past Surgical History:  Procedure Laterality Date  . ANKLE FRACTURE SURGERY  2008   lt  . BREAST SURGERY  2007   lt br bx  . GASTRIC BYPASS  10/13   has lost 100 lb  . KNEE ARTHROSCOPY  2008   left  . OPEN REDUCTION INTERNAL FIXATION (ORIF) DISTAL RADIAL FRACTURE Left 05/08/2016   Procedure: Left distal radius open reduction and internal fixation and repair as indicated;  Surgeon: Iran Planas, MD;  Location: [facility];  Service: Orthopedics;  Laterality: Left;  . REPLACEMENT TOTAL KNEE  2011   right and left total knees in [place]  . TOOTH EXTRACTION Right 08/24/2012   Procedure: SURGICAL REMOVAL OF ODONTOGENIC KERATOCYST AND EXTRACT TEETH NUMBERS 30, AND 31;  Surgeon: Ceasar Mons, DDS;  Location: [facility];  Service: Oral Surgery;  Laterality: Right;   Past Medical History:  Diagnosis Date  . Coronary artery disease    patient denies this  . DJD (degenerative joint disease)   . Wears glasses    BP 120/80   Pulse (!) 56   SpO2 92%   Opioid Risk Score:   Fall Risk Score:  `1  Depression screen PHQ 2/9  Depression screen [facility] 2/9 12/04/2016 09/05/2016  Decreased Interest 0 0  Down, Depressed, Hopeless 0 0  PHQ - 2 Score 0 0  Altered sleeping - 1  Tired, decreased energy - 1  Change in appetite - 0  Feeling bad or failure about yourself  - 0  Trouble concentrating - 0  Moving slowly or fidgety/restless - 0  Suicidal thoughts - 0  PHQ-9 Score - 2  Difficult doing work/chores - Not difficult at all      Review of Systems  Constitutional: Negative.   HENT: Negative.   Eyes: Negative.   Respiratory: Negative.   Cardiovascular: Negative.   Gastrointestinal: Negative.   Endocrine: Negative.   Genitourinary: Negative.   Musculoskeletal: Positive for arthralgias, gait problem and myalgias.  Skin: Negative.   Allergic/Immunologic: Negative.   Neurological: Positive for dizziness and headaches.  Hematological: Negative.   Psychiatric/Behavioral: Negative.   All other systems reviewed and are negative.     Objective:   Physical Exam Gen: NAD. Vital signs reviewed HENT: Normocephalic, Atraumatic Eyes: EOMI. No discharge.  Cardio: RRR. No JVD. Pulm: B/l clear to auscultation.  Effort normal. Abd: Soft, BS+ MSK:  Gait antalgic.   +posterior neck, and traps R>L, and shoulder.    No TTP frontal head  Neuro:   Strength  4+/5 b/l UE throughout (pain inhibition) Skin: Warm and Dry. Intact.     Assessment & Plan:  61 y/o female with pmh/psh left ankle fracture, right knee TKR, right rotator cuff repair, ORIF left radius due to work injury presents for follow up of headaches.   1. Headaches - Multifactorial - posterior and anterior, likely different, but overlapping etiologies  Cervicalgia + post concussive syndrome + sleep disturbance  Cont Heat  Will not use Propranolol due to low HR  Patient received regular eye exams  Cont good water intake  Cont low stim environment, use of sunglasses when in sun, limited computer, TV use  Cont Lidoderm patch - for frontal headache  Will increase Topamax to 50 BID  Will order xrays of C-spine  Will consider Botox in future as well  2.Myalgia  Trigger point injections with temporary benefit, will consider with steroids  Will change Robaxin 750 TID PRN to valium 2mg  TID PRN  Cont Voltaren gel   3. Sleep disturbance  Will change elavil to Pamelor 25qhs due to sedation  4. Anxiety  With panic attacks  Valium 2mg  TID PRN

## 2017-01-09 ENCOUNTER — Encounter: Payer: Self-pay | Admitting: Physical Medicine & Rehabilitation

## 2017-01-09 ENCOUNTER — Telehealth: Payer: Self-pay | Admitting: *Deleted

## 2017-01-09 ENCOUNTER — Encounter
Payer: Worker's Compensation | Attending: Physical Medicine & Rehabilitation | Admitting: Physical Medicine & Rehabilitation

## 2017-01-09 VITALS — BP 100/68 | HR 54 | Resp 18

## 2017-01-09 DIAGNOSIS — S0990XS Unspecified injury of head, sequela: Secondary | ICD-10-CM

## 2017-01-09 DIAGNOSIS — F411 Generalized anxiety disorder: Secondary | ICD-10-CM

## 2017-01-09 DIAGNOSIS — W010XXS Fall on same level from slipping, tripping and stumbling without subsequent striking against object, sequela: Secondary | ICD-10-CM | POA: Diagnosis not present

## 2017-01-09 DIAGNOSIS — G479 Sleep disorder, unspecified: Secondary | ICD-10-CM

## 2017-01-09 DIAGNOSIS — F410 Panic disorder [episodic paroxysmal anxiety] without agoraphobia: Secondary | ICD-10-CM | POA: Insufficient documentation

## 2017-01-09 DIAGNOSIS — F0781 Postconcussional syndrome: Secondary | ICD-10-CM

## 2017-01-09 DIAGNOSIS — G44309 Post-traumatic headache, unspecified, not intractable: Secondary | ICD-10-CM

## 2017-01-09 DIAGNOSIS — M791 Myalgia, unspecified site: Secondary | ICD-10-CM

## 2017-01-09 DIAGNOSIS — Z9889 Other specified postprocedural states: Secondary | ICD-10-CM | POA: Diagnosis not present

## 2017-01-09 DIAGNOSIS — R51 Headache: Secondary | ICD-10-CM | POA: Insufficient documentation

## 2017-01-09 MED ORDER — NORTRIPTYLINE HCL 10 MG PO CAPS: 10.0000 mg | ORAL_CAPSULE | Freq: Every day | ORAL | 1 refills | Status: DC

## 2017-01-09 NOTE — Telephone Encounter (Signed)
Ms Heick called and says she forgot to get a letter stating she is to be out of work until next appt (01/30/17).  New letter can be sent through [place] (instead of the mail version). The letter for her CM was not specific as they need target date in writing.

## 2017-01-09 NOTE — Progress Notes (Signed)
Subjective:    Patient ID: Sara Mendez, female    DOB: 08-19-1955, 61 y.o.   MRN: [id number]  HPI 61 y/o female with pmh/psh left ankle fracture, right knee TKR, right rotator cuff repair, ORIF left radius due to work injury presents for follow up for headaches.  Initially stated: Located frontal head.  Started ~06/2016. Injury 12/21/117. Initial injury was at work where she got foot caught in rug and fell hitting head against the wall.  Denies LOC, but notes being "dazed". She is not on blood thinners.  Her work is mainly sedentary, spending about 5 hours looking at a computer.  Inactivity improves the pain. Lights, noise exacerbate the pain. Sharp.  Intermittent.  Associated dizziness. Denies falls. Pain limits from watching TV and doing computer work.    Last clinic visit 12/04/16.  Since last visit, she states it hurts worse with increase in dizziness.  She states the headaches are worse too.  C-spine xrays report with degenerative changes C6-7.  She continues to take medications as necessary.  Robaxin and Valium provide mild benefit. Pamelor causes sedation.  She is trying to do more activities.    Pain Inventory Average Pain 7 Pain Right Now 7 My pain is sharp  In the last 24 hours, has pain interfered with the following? General activity 7 Relation with others 7 Enjoyment of life 7 What TIME of day is your pain at its worst? all, daytime Sleep (in general) Fair  Pain is worse with: walking, bending, sitting, standing and some activites Pain improves with: rest, therapy/exercise and medication Relief from Meds: 5  Mobility walk without assistance do you drive?  no  Function employed # of hrs/week 40  Neuro/Psych dizziness anxiety  Prior Studies Any changes since last visit?  no  Physicians involved in your care Any changes since last visit?  no   History reviewed. No pertinent family history of head trauma.  Social History   Social History  . Marital status:  Divorced    Spouse name: N/A  . Number of children: N/A  . Years of education: N/A   Social History Main Topics  . Smoking status: Never Smoker  . Smokeless tobacco: Never Used  . Alcohol use No  . Drug use: No  . Sexual activity: Not Asked   Other Topics Concern  . None   Social History Narrative  . None   Past Surgical History:  Procedure Laterality Date  . ANKLE FRACTURE SURGERY  2008   lt  . BREAST SURGERY  2007   lt br bx  . GASTRIC BYPASS  10/13   has lost 100 lb  . KNEE ARTHROSCOPY  2008   left  . OPEN REDUCTION INTERNAL FIXATION (ORIF) DISTAL RADIAL FRACTURE Left 05/08/2016   Procedure: Left distal radius open reduction and internal fixation and repair as indicated;  Surgeon: Iran Planas, MD;  Location: [facility];  Service: Orthopedics;  Laterality: Left;  . REPLACEMENT TOTAL KNEE  2011   right and left total knees in [place]  . TOOTH EXTRACTION Right 08/24/2012   Procedure: SURGICAL REMOVAL OF ODONTOGENIC KERATOCYST AND EXTRACT TEETH NUMBERS 30, AND 31;  Surgeon: Ceasar Mons, DDS;  Location: [facility];  Service: Oral Surgery;  Laterality: Right;   Past Medical History:  Diagnosis Date  . Coronary artery disease    patient denies this  . DJD (degenerative joint disease)   . Wears glasses    BP 100/68 (BP Location: Right Arm,  Patient Position: Sitting, Cuff Size: Normal)   Pulse (!) 54   Resp 18   SpO2 97%   Opioid Risk Score:   Fall Risk Score:  `1  Depression screen PHQ 2/9  Depression screen [facility] 2/9 12/04/2016 09/05/2016  Decreased Interest 0 0  Down, Depressed, Hopeless 0 0  PHQ - 2 Score 0 0  Altered sleeping - 1  Tired, decreased energy - 1  Change in appetite - 0  Feeling bad or failure about yourself  - 0  Trouble concentrating - 0  Moving slowly or fidgety/restless - 0  Suicidal thoughts - 0  PHQ-9 Score - 2  Difficult doing work/chores - Not difficult at all    Review of Systems  Constitutional: Negative.   HENT:  Negative.   Eyes: Negative.   Respiratory: Negative.   Cardiovascular: Negative.   Gastrointestinal: Negative.   Endocrine: Negative.   Genitourinary: Negative.   Musculoskeletal: Positive for arthralgias, gait problem and myalgias.  Skin: Negative.   Allergic/Immunologic: Negative.   Neurological: Positive for dizziness and headaches.  Hematological: Negative.   Psychiatric/Behavioral: Negative.   All other systems reviewed and are negative.     Objective:   Physical Exam Gen: NAD. Vital signs reviewed HENT: Normocephalic, Atraumatic Eyes: EOMI. No discharge.  Cardio: RRR. No JVD. Pulm: B/l clear to auscultation.  Effort normal. Abd: Soft, BS+ MSK:  Gait antalgic.   No TTP    No TTP frontal head  Neuro:   Strength  4+/5 b/l UE throughout (pain inhibition) Skin: Warm and Dry. Intact.     Assessment & Plan:  61 y/o female with pmh/psh left ankle fracture, right knee TKR, right rotator cuff repair, ORIF left radius due to work injury presents for follow up of headaches.   1. Headaches - Multifactorial - posterior and anterior, likely different, but overlapping etiologies  Cervicalgia + post concussive syndrome + sleep disturbance  Cont Heat  Will not use Propranolol due to low HR  Patient received regular eye exams  Cont good water intake  Cont low stim environment, use of sunglasses when in sun, limited computer, TV use  Cont Lidoderm patch - for frontal headache  Cont Topamax 50 BID, causes patient to be sluggish  C-spine xrays reviewed suggesting degenerative changes C6-7  Patient appears to be more active and able to do more, but headaches get worse as she tries to do more  Will consider Botox after trial of trigger points  2.Myalgia  Trigger point injections with temporary benefit, will perform with steroids and repeat with anesthetic x2  Cont Robaxin 750 TID PRN   Cont valium 2mg  TID PRN  Cont Voltaren gel   3. Sleep disturbance  Unable to tolerate  Elavil  Pamelor decreased to 10mg  qhs due to sedation  4. Anxiety  With panic attacks  Valium 2mg  TID PRN  >40 minutes spent with patient with >35 minutes in counseling and educating regarding Botox, trigger point injections, and further interventions

## 2017-01-21 ENCOUNTER — Telehealth: Payer: Self-pay | Admitting: *Deleted

## 2017-01-21 NOTE — Telephone Encounter (Signed)
I believe she informed me that she receives regular eye exams, but if she has not seen someone, we may refer her to a Neurooptomatrist. Thanks.

## 2017-01-21 NOTE — Telephone Encounter (Signed)
Sara Mendez called and is asking about Dr Posey Pronto referring her to an opthamologist/optometrist for an eye exam related to her fall and work related injury. Please advise.

## 2017-01-22 NOTE — Telephone Encounter (Signed)
Thanks

## 2017-01-22 NOTE — Telephone Encounter (Signed)
Called patient, she stated that she should be able to obtain transportation to that area and to please proceed with the referral for the eye specialist and to let her know when it is done so she can make sure the referral makes it to her workers comp people

## 2017-01-22 NOTE — Telephone Encounter (Signed)
The only neuro (opthamologist) I am aware of is Dr Frederico Hamman with [facility].

## 2017-01-22 NOTE — Telephone Encounter (Signed)
If she can make it to [place], [place], would prefer to refer her to Lauretta Grill O.D. With [employer].  Thanks.

## 2017-01-30 ENCOUNTER — Encounter
Payer: Worker's Compensation | Attending: Physical Medicine & Rehabilitation | Admitting: Physical Medicine & Rehabilitation

## 2017-01-30 ENCOUNTER — Encounter: Payer: Self-pay | Admitting: Physical Medicine & Rehabilitation

## 2017-01-30 VITALS — BP 109/74 | HR 53

## 2017-01-30 DIAGNOSIS — M791 Myalgia, unspecified site: Secondary | ICD-10-CM

## 2017-01-30 NOTE — Progress Notes (Signed)
Trigger point injection procedure note: Trigger Point Injection: Written consent was obtained for the patient. Trigger points were identified of right cervical paraspinals and trapezii muscles. The areas were cleaned with alcohol, and each of  these trigger points were injected with 1 cc of 0.5% Marcaine (x5). Needle draw back was performed. There were no complications from the procedure, and it was well tolerated.   Plan: Referral made to massage therapy

## 2017-01-30 NOTE — Patient Instructions (Signed)
Pt not to return to work for at least 4 weeks

## 2017-02-05 ENCOUNTER — Encounter
Payer: Worker's Compensation | Attending: Physical Medicine & Rehabilitation | Admitting: Physical Medicine & Rehabilitation

## 2017-02-05 ENCOUNTER — Encounter: Payer: Self-pay | Admitting: Physical Medicine & Rehabilitation

## 2017-02-05 VITALS — BP 103/69 | HR 50 | Resp 14

## 2017-02-05 DIAGNOSIS — M791 Myalgia, unspecified site: Secondary | ICD-10-CM

## 2017-02-05 NOTE — Progress Notes (Signed)
Trigger point injection procedure note: Trigger Point Injection: Written consent was obtained for the patient. Trigger points were identified of right cervical paraspinals and trapezii muscles. The areas were cleaned with alcohol, and each of  these trigger points were injected with 1 cc of 0.5% Marcaine (x3). Needle draw back was performed. There were no complications from the procedure, and it was well tolerated.

## 2017-02-12 ENCOUNTER — Encounter
Payer: Worker's Compensation | Attending: Physical Medicine & Rehabilitation | Admitting: Physical Medicine & Rehabilitation

## 2017-02-12 ENCOUNTER — Encounter: Payer: Self-pay | Admitting: Physical Medicine & Rehabilitation

## 2017-02-12 VITALS — BP 102/69 | HR 71

## 2017-02-12 DIAGNOSIS — M7912 Myalgia of auxiliary muscles, head and neck: Secondary | ICD-10-CM

## 2017-02-12 DIAGNOSIS — M791 Myalgia, unspecified site: Secondary | ICD-10-CM | POA: Diagnosis present

## 2017-02-12 NOTE — Progress Notes (Signed)
Trigger point injection procedure note: Trigger Point Injection: Written consent was obtained for the patient. Trigger points were identified of right cervical paraspinals and and b/l trapezii muscles. The areas were cleaned with alcohol, and each of  these trigger points were injected with 1 cc of 0.5% Marcaine (x3). Needle draw back was performed. There were no complications from the procedure, and it was well tolerated.

## 2017-02-12 NOTE — Patient Instructions (Signed)
Patient not to return to work until symptoms free for 2 weeks.

## 2017-02-19 ENCOUNTER — Encounter
Payer: Worker's Compensation | Attending: Physical Medicine & Rehabilitation | Admitting: Physical Medicine & Rehabilitation

## 2017-02-19 ENCOUNTER — Encounter: Payer: Self-pay | Admitting: Physical Medicine & Rehabilitation

## 2017-02-19 VITALS — BP 129/78 | HR 56 | Resp 14

## 2017-02-19 DIAGNOSIS — S52502D Unspecified fracture of the lower end of left radius, subsequent encounter for closed fracture with routine healing: Secondary | ICD-10-CM | POA: Diagnosis present

## 2017-02-19 DIAGNOSIS — G44309 Post-traumatic headache, unspecified, not intractable: Secondary | ICD-10-CM

## 2017-02-19 DIAGNOSIS — M1990 Unspecified osteoarthritis, unspecified site: Secondary | ICD-10-CM | POA: Insufficient documentation

## 2017-02-19 DIAGNOSIS — M791 Myalgia, unspecified site: Secondary | ICD-10-CM | POA: Diagnosis not present

## 2017-02-19 DIAGNOSIS — F0781 Postconcussional syndrome: Secondary | ICD-10-CM

## 2017-02-19 DIAGNOSIS — F410 Panic disorder [episodic paroxysmal anxiety] without agoraphobia: Secondary | ICD-10-CM | POA: Insufficient documentation

## 2017-02-19 DIAGNOSIS — X58XXXD Exposure to other specified factors, subsequent encounter: Secondary | ICD-10-CM | POA: Diagnosis not present

## 2017-02-19 DIAGNOSIS — G479 Sleep disorder, unspecified: Secondary | ICD-10-CM | POA: Insufficient documentation

## 2017-02-19 DIAGNOSIS — F411 Generalized anxiety disorder: Secondary | ICD-10-CM | POA: Diagnosis not present

## 2017-02-19 DIAGNOSIS — I2510 Atherosclerotic heart disease of native coronary artery without angina pectoris: Secondary | ICD-10-CM | POA: Insufficient documentation

## 2017-02-19 DIAGNOSIS — Z9889 Other specified postprocedural states: Secondary | ICD-10-CM | POA: Diagnosis not present

## 2017-02-19 DIAGNOSIS — G43011 Migraine without aura, intractable, with status migrainosus: Secondary | ICD-10-CM

## 2017-02-19 DIAGNOSIS — Z9884 Bariatric surgery status: Secondary | ICD-10-CM | POA: Insufficient documentation

## 2017-02-19 DIAGNOSIS — R51 Headache: Secondary | ICD-10-CM | POA: Insufficient documentation

## 2017-02-19 DIAGNOSIS — Z96653 Presence of artificial knee joint, bilateral: Secondary | ICD-10-CM | POA: Insufficient documentation

## 2017-02-19 DIAGNOSIS — S0990XS Unspecified injury of head, sequela: Secondary | ICD-10-CM | POA: Diagnosis not present

## 2017-02-19 MED ORDER — TOPIRAMATE 100 MG PO TABS: 100.0000 mg | ORAL_TABLET | Freq: Two times a day (BID) | ORAL | 1 refills | Status: DC

## 2017-02-19 MED ORDER — METHOCARBAMOL 750 MG PO TABS: 750.0000 mg | ORAL_TABLET | Freq: Three times a day (TID) | ORAL | 1 refills | Status: DC | PRN

## 2017-02-19 NOTE — Progress Notes (Signed)
Subjective:    Patient ID: Sara Mendez, female    DOB: December 24, 1955, 61 y.o.   MRN: [id number]  HPI 61 y/o female with pmh/psh left ankle fracture, right knee TKR, right rotator cuff repair, ORIF left radius due to work injury presents for follow up for headaches.  Initially stated: Located frontal head.  Started ~06/2016. Injury 12/21/117. Initial injury was at work where she got foot caught in rug and fell hitting head against the wall.  Denies LOC, but notes being "dazed". She is not on blood thinners.  Her work is mainly sedentary, spending about 5 hours looking at a computer.  Inactivity improves the pain. Lights, noise exacerbate the pain. Sharp.  Intermittent.  Associated dizziness. Denies falls. Pain limits from watching TV and doing computer work.    Last clinic visit 02/12/17.  She had trigger point injections at that time.  She notes improvement with injections for headaches.  Pain is less intense.  She continues to have low stim environment. She uses Lidoderm patches.  She is tolerating the Topomax. She has done well with Pamelor.    Pain Inventory Average Pain 4 Pain Right Now 4 My pain is intermittent, sharp, stabbing and aching  In the last 24 hours, has pain interfered with the following? General activity 5 Relation with others 5 Enjoyment of life 8 What TIME of day is your pain at its worst? daytime, evening Pain is worse with: walking, bending, sitting, standing and some activites Pain improves with: rest, therapy/exercise and medication Relief from Meds: 5  Mobility walk without assistance do you drive?  no  Function employed # of hrs/week 40  Neuro/Psych dizziness anxiety  Prior Studies Any changes since last visit?  no  Physicians involved in your care Any changes since last visit?  no   History reviewed. No pertinent family history of head trauma.  Social History   Social History  . Marital status: Divorced    Spouse name: N/A  . Number of  children: N/A  . Years of education: N/A   Social History Main Topics  . Smoking status: Never Smoker  . Smokeless tobacco: Never Used  . Alcohol use No  . Drug use: No  . Sexual activity: Not Asked   Other Topics Concern  . None   Social History Narrative  . None   Past Surgical History:  Procedure Laterality Date  . ANKLE FRACTURE SURGERY  2008   lt  . BREAST SURGERY  2007   lt br bx  . GASTRIC BYPASS  10/13   has lost 100 lb  . KNEE ARTHROSCOPY  2008   left  . OPEN REDUCTION INTERNAL FIXATION (ORIF) DISTAL RADIAL FRACTURE Left 05/08/2016   Procedure: Left distal radius open reduction and internal fixation and repair as indicated;  Surgeon: Iran Planas, MD;  Location: [facility];  Service: Orthopedics;  Laterality: Left;  . REPLACEMENT TOTAL KNEE  2011   right and left total knees in [place]  . TOOTH EXTRACTION Right 08/24/2012   Procedure: SURGICAL REMOVAL OF ODONTOGENIC KERATOCYST AND EXTRACT TEETH NUMBERS 30, AND 31;  Surgeon: Ceasar Mons, DDS;  Location: [place];  Service: Oral Surgery;  Laterality: Right;   Past Medical History:  Diagnosis Date  . Coronary artery disease    patient denies this  . DJD (degenerative joint disease)   . Wears glasses    BP 129/78   Pulse (!) 56   Resp 14   SpO2 95%  Opioid Risk Score:   Fall Risk Score:  `1  Depression screen PHQ 2/9  Depression screen [facility] 2/9 12/04/2016 09/05/2016  Decreased Interest 0 0  Down, Depressed, Hopeless 0 0  PHQ - 2 Score 0 0  Altered sleeping - 1  Tired, decreased energy - 1  Change in appetite - 0  Feeling bad or failure about yourself  - 0  Trouble concentrating - 0  Moving slowly or fidgety/restless - 0  Suicidal thoughts - 0  PHQ-9 Score - 2  Difficult doing work/chores - Not difficult at all    Review of Systems  Constitutional: Negative.   HENT: Negative.   Eyes: Negative.   Respiratory: Negative.   Cardiovascular: Negative.   Gastrointestinal: Negative.     Endocrine: Negative.   Genitourinary: Negative.   Musculoskeletal: Positive for arthralgias, gait problem and myalgias.  Skin: Negative.   Allergic/Immunologic: Negative.   Neurological: Positive for dizziness and headaches.  Hematological: Negative.   Psychiatric/Behavioral: The patient is nervous/anxious.   All other systems reviewed and are negative.     Objective:   Physical Exam Gen: NAD. Vital signs reviewed HENT: Normocephalic, Atraumatic Eyes: EOMI. No discharge.  Cardio: RRR. No JVD. Pulm: B/l clear to auscultation.  Effort normal. Abd: Soft, BS+ MSK:  Gait antalgic.   No TTP    No TTP frontal head  Neuro:   Strength  4+/5 b/l UE throughout (pain inhibition) Skin: Warm and Dry. Intact.     Assessment & Plan:  61 y/o female with pmh/psh left ankle fracture, right knee TKR, right rotator cuff repair, ORIF left radius due to work injury presents for follow up of headaches.   1. Headaches/M[place]aines - Multifactorial - posterior and anterior, likely different, but overlapping etiologies  Cervicalgia + post concussive syndrome + sleep disturbance  Cont Heat  Will not use Propranolol due to low HR  Patient received regular eye exams  Cont good water intake  Cont low stim environment, use of sunglasses when in sun, limited computer, TV use  Cont Lidoderm patch - for frontal headache  Will increase Topamax to 100 BID  C-spine xrays reviewed suggesting degenerative changes C6-7  Patient appears to be more active and able to do more, but headaches get worse as she tries to do more  Discussed Botox due to benefit from trigger point injections: Currently daily symptoms > front and right side.  No aura.  Photophobia, sounds trigger.  Average 5/10.  Pt would like to some time to consider.  Referral to Neuro-Ophtho  Referral for massage therapy again  2.Myalgia  Trigger point injections with benefit, performed with steroids and repeat with anesthetic x2, with good, but not  complete benefit  Cont Robaxin 750 TID PRN   Cont valium 2mg  TID PRN  Cont Voltaren gel   3. Sleep disturbance  Unable to tolerate Elavil  Cont Pamelor 10mg  qhs due to sedation  4. Anxiety  With panic attacks  Valium 2mg  TID PRN, educated to avoid contaminant use with Robaxin

## 2017-03-04 ENCOUNTER — Other Ambulatory Visit: Payer: Self-pay | Admitting: Physical Medicine & Rehabilitation

## 2017-03-05 ENCOUNTER — Encounter: Payer: 59 | Admitting: Physical Medicine & Rehabilitation

## 2017-03-19 ENCOUNTER — Telehealth: Payer: Self-pay | Admitting: *Deleted

## 2017-03-19 ENCOUNTER — Encounter: Payer: Self-pay | Admitting: Physical Medicine & Rehabilitation

## 2017-03-19 DIAGNOSIS — G43011 Migraine without aura, intractable, with status migrainosus: Secondary | ICD-10-CM

## 2017-03-19 DIAGNOSIS — G44309 Post-traumatic headache, unspecified, not intractable: Secondary | ICD-10-CM

## 2017-03-19 DIAGNOSIS — F0781 Postconcussional syndrome: Secondary | ICD-10-CM

## 2017-03-19 DIAGNOSIS — M791 Myalgia, unspecified site: Secondary | ICD-10-CM

## 2017-03-19 NOTE — Telephone Encounter (Signed)
Scheduled PT referral for [facility].

## 2017-03-26 ENCOUNTER — Encounter: Payer: 59 | Admitting: Physical Medicine & Rehabilitation

## 2017-05-21 ENCOUNTER — Other Ambulatory Visit: Payer: Self-pay

## 2017-05-21 ENCOUNTER — Encounter: Payer: Self-pay | Admitting: Physical Medicine & Rehabilitation

## 2017-05-21 ENCOUNTER — Encounter
Payer: BLUE CROSS/BLUE SHIELD | Attending: Physical Medicine & Rehabilitation | Admitting: Physical Medicine & Rehabilitation

## 2017-05-21 VITALS — BP 116/78 | HR 59

## 2017-05-21 DIAGNOSIS — M791 Myalgia, unspecified site: Secondary | ICD-10-CM

## 2017-05-21 DIAGNOSIS — Z9889 Other specified postprocedural states: Secondary | ICD-10-CM | POA: Diagnosis not present

## 2017-05-21 DIAGNOSIS — G44309 Post-traumatic headache, unspecified, not intractable: Secondary | ICD-10-CM

## 2017-05-21 DIAGNOSIS — S0990XS Unspecified injury of head, sequela: Secondary | ICD-10-CM

## 2017-05-21 DIAGNOSIS — F0781 Postconcussional syndrome: Secondary | ICD-10-CM | POA: Diagnosis present

## 2017-05-21 DIAGNOSIS — F410 Panic disorder [episodic paroxysmal anxiety] without agoraphobia: Secondary | ICD-10-CM | POA: Insufficient documentation

## 2017-05-21 DIAGNOSIS — G43011 Migraine without aura, intractable, with status migrainosus: Secondary | ICD-10-CM | POA: Diagnosis not present

## 2017-05-21 DIAGNOSIS — G479 Sleep disorder, unspecified: Secondary | ICD-10-CM | POA: Diagnosis not present

## 2017-05-21 MED ORDER — TOPIRAMATE 100 MG PO TABS: 100.0000 mg | ORAL_TABLET | Freq: Two times a day (BID) | ORAL | 1 refills | Status: AC

## 2017-05-21 MED ORDER — NORTRIPTYLINE HCL 10 MG PO CAPS: 10.0000 mg | ORAL_CAPSULE | Freq: Every day | ORAL | 1 refills | Status: AC

## 2017-05-21 MED ORDER — LIDOCAINE 5 % EX PTCH: 1.0000 | MEDICATED_PATCH | CUTANEOUS | 1 refills | Status: AC

## 2017-05-21 MED ORDER — METHOCARBAMOL 750 MG PO TABS: 750.0000 mg | ORAL_TABLET | Freq: Three times a day (TID) | ORAL | 1 refills | Status: AC | PRN

## 2017-05-21 NOTE — Progress Notes (Signed)
Subjective:    Patient ID: Sara Mendez, female    DOB: 01-17-1956, 62 y.o.   MRN: [id number]  HPI 62 y/o female with pmh/psh left ankle fracture, right knee TKR, right rotator cuff repair, ORIF left radius due to work injury presents for follow up for headaches.  Initially stated: Located frontal head.  Started ~06/2016. Injury 12/21/117. Initial injury was at work where she got foot caught in rug and fell hitting head against the wall.  Denies LOC, but notes being "dazed". She is not on blood thinners.  Her work is mainly sedentary, spending about 5 hours looking at a computer.  Inactivity improves the pain. Lights, noise exacerbate the pain. Sharp.  Intermittent.  Associated dizziness. Denies falls. Pain limits from watching TV and doing computer work.    Last clinic visit 02/19/17.  Since last visit, pt states she continues to require sunglasses.  She states she is reading more, but require more time.  She notes decrease in frequency of headaches, but still present.  She notes good improvement with Topomax. She has not been to Neuro-Ophtho.  She saw her optometrist. She has not been to massage therapy either.  She continues to take Robaxin with good benefit.  She has not been taking Valium. She is taking Pamelor as needed. Overall, she is improving, but notes it is slow improvement.   Pain Inventory Average Pain 4 Pain Right Now 4 My pain is intermittent and sharp  In the last 24 hours, has pain interfered with the following? General activity 4 Relation with others 3 Enjoyment of life 5 What TIME of day is your pain at its worst? daytime, evening Pain is worse with: walking, bending, sitting, standing and some activites Pain improves with: rest, therapy/exercise and medication Relief from Meds: 5  Mobility walk without assistance how many minutes can you walk? Marland Kitchen ability to climb steps?  yes do you drive?  yes Do you have any goals in this area?  no  Function not employed: date  last employed .  Neuro/Psych dizziness anxiety  Prior Studies Any changes since last visit?  no  Physicians involved in your care Any changes since last visit?  no   History reviewed. No pertinent family history of head trauma.  Social History   Socioeconomic History  . Marital status: Divorced    Spouse name: None  . Number of children: None  . Years of education: None  . Highest education level: None  Social Needs  . Financial resource strain: None  . Food insecurity - worry: None  . Food insecurity - inability: None  . Transportation needs - medical: None  . Transportation needs - non-medical: None  Occupational History  . None  Tobacco Use  . Smoking status: Never Smoker  . Smokeless tobacco: Never Used  Substance and Sexual Activity  . Alcohol use: No  . Drug use: No  . Sexual activity: None  Other Topics Concern  . None  Social History Narrative  . None   Past Surgical History:  Procedure Laterality Date  . ANKLE FRACTURE SURGERY  2008   lt  . BREAST SURGERY  2007   lt br bx  . GASTRIC BYPASS  10/13   has lost 100 lb  . KNEE ARTHROSCOPY  2008   left  . OPEN REDUCTION INTERNAL FIXATION (ORIF) DISTAL RADIAL FRACTURE Left 05/08/2016   Procedure: Left distal radius open reduction and internal fixation and repair as indicated;  Surgeon: Iran Planas, MD;  Location: [place];  Service: Orthopedics;  Laterality: Left;  . REPLACEMENT TOTAL KNEE  2011   right and left total knees in [place]  . TOOTH EXTRACTION Right 08/24/2012   Procedure: SURGICAL REMOVAL OF ODONTOGENIC KERATOCYST AND EXTRACT TEETH NUMBERS 30, AND 31;  Surgeon: Ceasar Mons, DDS;  Location: [place];  Service: Oral Surgery;  Laterality: Right;   Past Medical History:  Diagnosis Date  . Coronary artery disease    patient denies this  . DJD (degenerative joint disease)   . Wears glasses    BP 116/78   Pulse (!) 59   SpO2 95%   Opioid Risk Score:   Fall Risk Score:   `1  Depression screen PHQ 2/9  Depression screen [facility] 2/9 05/21/2017 12/04/2016 09/05/2016  Decreased Interest 0 0 0  Down, Depressed, Hopeless 0 0 0  PHQ - 2 Score 0 0 0  Altered sleeping - - 1  Tired, decreased energy - - 1  Change in appetite - - 0  Feeling bad or failure about yourself  - - 0  Trouble concentrating - - 0  Moving slowly or fidgety/restless - - 0  Suicidal thoughts - - 0  PHQ-9 Score - - 2  Difficult doing work/chores - - Not difficult at all    Review of Systems  Constitutional: Negative.   HENT: Negative.   Eyes: Negative.   Respiratory: Negative.   Cardiovascular: Negative.   Gastrointestinal: Negative.   Endocrine: Negative.   Genitourinary: Negative.   Musculoskeletal: Positive for arthralgias, gait problem and myalgias.  Skin: Negative.   Allergic/Immunologic: Negative.   Neurological: Positive for dizziness and headaches.  Hematological: Negative.   Psychiatric/Behavioral: The patient is nervous/anxious.   All other systems reviewed and are negative.     Objective:   Physical Exam Gen: NAD. Vital signs reviewed HENT: Normocephalic, Atraumatic Eyes: EOMI. No discharge.  Cardio: RRR. No JVD. Pulm: B/l clear to auscultation.  Effort normal. Abd: Soft, BS+ MSK:  Gait WNL.   No TTP frontal head  Neuro:   Strength  4+/5 b/l UE throughout (pain inhibition, improving) Skin: Warm and Dry. Intact.     Assessment & Plan:  62 y/o female with pmh/psh left ankle fracture, right knee TKR, right rotator cuff repair, ORIF left radius due to work injury presents for follow up of headaches.   1. Headaches/Migaines - Multifactorial - posterior and anterior, likely different, but overlapping etiologies  Cervicalgia + post concussive syndrome + sleep disturbance  Cont Heat  Will not use Propranolol due to low HR  Patient received regular eye exams  Cont good water intake  Cont low stim environment, use of sunglasses when in sun, limited computer, TV  use  Cont Lidoderm patch - for frontal headache  Cont Topamax to 100 BID  C-spine xrays reviewed suggesting degenerative changes C6-7  Patient appears to be more active and able to do more, but headaches get worse as she tries to do more  Discussed Botox due to benefit from trigger point injections: Currently daily symptoms > front and right side.  No aura.  Photophobia, sounds trigger.  Average 5/10.  Pt would still like some time to consider.  Referral to Neuro-Ophtho, encouraged follow up  Referral for massage therapy again, encouraged follow up  2.Myalgia  Trigger point injections with benefit, performed with steroids and repeat with anesthetic x2, with good, but not complete benefit  Cont Robaxin 750 TID PRN   Cont Valium 2mg  TID PRN  Cont Voltaren gel   3. Sleep disturbance  Unable to tolerate Elavil  Cont Pamelor 10mg  qhs due to sedation  4. Anxiety  With panic attacks  Valium 2mg  TID PRN, educated to avoid contaminant use with Robaxin

## 2017-05-30 ENCOUNTER — Telehealth: Payer: Self-pay

## 2017-05-30 NOTE — Telephone Encounter (Signed)
I recommended a Neuro Ophthalmologist at [facility] and massage therapy with PT that can be anywhere. Thanks.

## 2017-05-30 NOTE — Telephone Encounter (Signed)
Patient called stating that she forgot the names of the doctors that Dr. Posey Pronto referred her to go see. Optometrists and PT massage. She wants a call back to let her know. Please advise.

## 2017-06-03 NOTE — Telephone Encounter (Signed)
Contacted patient and informed that a referral was sent over to [facility], Dr. Gevena Cotton [phone]. I informed the patient that the physical therapy referral was held up as [employer] is requiring an Chief Executive Officer. I asked the patient if she was okay with [place] redirecting the referral to [facility] rehab on [address]....she said that would be fine. I spoke with our referral coordinator and referral was redirected

## 2017-06-13 ENCOUNTER — Other Ambulatory Visit: Payer: Self-pay

## 2017-06-13 ENCOUNTER — Ambulatory Visit: Payer: BLUE CROSS/BLUE SHIELD | Attending: Physical Medicine & Rehabilitation

## 2017-06-13 DIAGNOSIS — G44309 Post-traumatic headache, unspecified, not intractable: Secondary | ICD-10-CM | POA: Insufficient documentation

## 2017-06-13 NOTE — Therapy (Signed)
[place], [place], [address] Phone: [phone]   Fax:  [phone]  Patient Details  Name: ASHTIN MELICHAR MRN: [id number] Date of Birth: Sep 22, 1955 Referring Provider:  Jamse Arn, MD  Encounter Date: 06/13/2017                                                                                                       MS Pearline Cables arrived for the FCE testing and after some discussion of the purpose of the testing and why she needed the test she was unclear why she should have the test. 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  She has no plans on returning to a physical job due to knee replacements and previous ankle and wrist fracture. I will contact Dr Serita Grit office and ask if they really want the test performed. She stated she would return for the test if needed.   Darrel Hoover  PT 06/13/2017, 7:36 AM  [facility] [address] [place], [place], [address] Phone: [phone]   Fax:  [phone]

## 2017-07-18 ENCOUNTER — Ambulatory Visit: Payer: Self-pay | Admitting: Physical Medicine & Rehabilitation

## 2017-09-11 ENCOUNTER — Encounter: Payer: Managed Care, Other (non HMO) | Admitting: Physical Medicine & Rehabilitation

## 2017-09-12 ENCOUNTER — Encounter: Payer: Self-pay | Admitting: Physical Medicine & Rehabilitation

## 2017-09-12 ENCOUNTER — Encounter: Payer: Managed Care, Other (non HMO) | Admitting: Physical Medicine & Rehabilitation

## 2017-09-12 ENCOUNTER — Encounter
Payer: Managed Care, Other (non HMO) | Attending: Physical Medicine & Rehabilitation | Admitting: Physical Medicine & Rehabilitation

## 2017-09-12 ENCOUNTER — Other Ambulatory Visit: Payer: Self-pay

## 2017-09-12 VITALS — BP 106/71 | HR 64 | Ht 65.0 in | Wt 235.8 lb

## 2017-09-12 DIAGNOSIS — G43011 Migraine without aura, intractable, with status migrainosus: Secondary | ICD-10-CM | POA: Insufficient documentation

## 2017-09-12 DIAGNOSIS — S0990XS Unspecified injury of head, sequela: Secondary | ICD-10-CM | POA: Diagnosis not present

## 2017-09-12 DIAGNOSIS — M791 Myalgia, unspecified site: Secondary | ICD-10-CM | POA: Diagnosis present

## 2017-09-12 DIAGNOSIS — F410 Panic disorder [episodic paroxysmal anxiety] without agoraphobia: Secondary | ICD-10-CM | POA: Insufficient documentation

## 2017-09-12 DIAGNOSIS — F0781 Postconcussional syndrome: Secondary | ICD-10-CM | POA: Diagnosis not present

## 2017-09-12 DIAGNOSIS — F411 Generalized anxiety disorder: Secondary | ICD-10-CM

## 2017-09-12 DIAGNOSIS — G44309 Post-traumatic headache, unspecified, not intractable: Secondary | ICD-10-CM | POA: Insufficient documentation

## 2017-09-12 DIAGNOSIS — G479 Sleep disorder, unspecified: Secondary | ICD-10-CM

## 2017-09-12 DIAGNOSIS — Z9889 Other specified postprocedural states: Secondary | ICD-10-CM | POA: Diagnosis not present

## 2017-09-12 MED ORDER — PROPRANOLOL HCL 10 MG PO TABS: 10.0000 mg | ORAL_TABLET | Freq: Three times a day (TID) | ORAL | 1 refills | Status: AC

## 2017-09-12 NOTE — Progress Notes (Signed)
Subjective:    Patient ID: Sara Mendez, female    DOB: 11/07/1955, 62 y.o.   MRN: [id number]  HPI 63 y/o female with pmh/psh left ankle fracture, right knee TKR, right rotator cuff repair, ORIF left radius due to work injury presents for follow up for headaches.  Initially stated: Located frontal head.  Started ~06/2016. Injury 12/21/117. Initial injury was at work where she got foot caught in rug and fell hitting head against the wall.  Denies LOC, but notes being "dazed". She is not on blood thinners.  Her work is mainly sedentary, spending about 5 hours looking at a computer.  Inactivity improves the pain. Lights, noise exacerbate the pain. Sharp.  Intermittent.  Associated dizziness. Denies falls. Pain limits from watching TV and doing computer work.    Last clinic visit 05/21/17.  Since last visit, patient states she went to Neurooptho.  She was told he recommended MRI. Her headaches have improved, but still present, however, she is doing significantly more than before and working again. She had benefit with massage, but returned to work.  Anxiety has improved. She is no longer Topamax, Robaxin, Voltaren gel, Valium.  Occasional using patches and Pamelor.    Pain Inventory Average Pain 6 Pain Right Now 6 My pain is intermittent, sharp and aching  In the last 24 hours, has pain interfered with the following? General activity 4 Relation with others 4 Enjoyment of life 7 What TIME of day is your pain at its worst? daytime, evening Pain is worse with: walking, bending, sitting, standing and some activites Pain improves with: rest, therapy/exercise and medication Relief from Meds: 5  Mobility walk without assistance how many minutes can you walk? Marland Kitchen ability to climb steps?  yes do you drive?  yes Do you have any goals in this area?  no  Function not employed: date last employed .  Neuro/Psych dizziness anxiety  Prior Studies Any changes since last visit?  no  Physicians  involved in your care Any changes since last visit?  no   History reviewed. No pertinent family history of head trauma.  Social History   Socioeconomic History  . Marital status: Divorced    Spouse name: Not on file  . Number of children: Not on file  . Years of education: Not on file  . Highest education level: Not on file  Occupational History  . Not on file  Social Needs  . Financial resource strain: Not on file  . Food insecurity:    Worry: Not on file    Inability: Not on file  . Transportation needs:    Medical: Not on file    Non-medical: Not on file  Tobacco Use  . Smoking status: Never Smoker  . Smokeless tobacco: Never Used  Substance and Sexual Activity  . Alcohol use: No  . Drug use: No  . Sexual activity: Not on file  Lifestyle  . Physical activity:    Days per week: Not on file    Minutes per session: Not on file  . Stress: Not on file  Relationships  . Social connections:    Talks on phone: Not on file    Gets together: Not on file    Attends religious service: Not on file    Active member of club or organization: Not on file    Attends meetings of clubs or organizations: Not on file    Relationship status: Not on file  Other Topics Concern  . Not on  file  Social History Narrative  . Not on file   Past Surgical History:  Procedure Laterality Date  . ANKLE FRACTURE SURGERY  2008   lt  . BREAST SURGERY  2007   lt br bx  . GASTRIC BYPASS  10/13   has lost 100 lb  . KNEE ARTHROSCOPY  2008   left  . OPEN REDUCTION INTERNAL FIXATION (ORIF) DISTAL RADIAL FRACTURE Left 05/08/2016   Procedure: Left distal radius open reduction and internal fixation and repair as indicated;  Surgeon: Iran Planas, MD;  Location: [place];  Service: Orthopedics;  Laterality: Left;  . REPLACEMENT TOTAL KNEE  2011   right and left total knees in [place]  . TOOTH EXTRACTION Right 08/24/2012   Procedure: SURGICAL REMOVAL OF ODONTOGENIC KERATOCYST AND EXTRACT TEETH NUMBERS  30, AND 31;  Surgeon: Ceasar Mons, DDS;  Location: [place];  Service: Oral Surgery;  Laterality: Right;   Past Medical History:  Diagnosis Date  . Coronary artery disease    patient denies this  . DJD (degenerative joint disease)   . Wears glasses    BP 106/71   Pulse 64   Ht 5\' 5"  (1.651 m) Comment: pt reported  Wt 235 lb 12.8 oz (107 kg)   SpO2 98%   BMI 39.24 kg/m   Opioid Risk Score:   Fall Risk Score:  `1  Depression screen PHQ 2/9  Depression screen [facility] 2/9 09/12/2017 05/21/2017 12/04/2016 09/05/2016  Decreased Interest 0 0 0 0  Down, Depressed, Hopeless 0 0 0 0  PHQ - 2 Score 0 0 0 0  Altered sleeping - - - 1  Tired, decreased energy - - - 1  Change in appetite - - - 0  Feeling bad or failure about yourself  - - - 0  Trouble concentrating - - - 0  Moving slowly or fidgety/restless - - - 0  Suicidal thoughts - - - 0  PHQ-9 Score - - - 2  Difficult doing work/chores - - - Not difficult at all    Review of Systems  Constitutional: Negative.   HENT: Negative.   Eyes: Negative.   Respiratory: Negative.   Cardiovascular: Negative.   Gastrointestinal: Negative.   Endocrine: Negative.   Genitourinary: Negative.   Musculoskeletal: Positive for arthralgias, gait problem and myalgias.  Skin: Negative.   Allergic/Immunologic: Negative.   Neurological: Positive for dizziness and headaches.  Hematological: Negative.   Psychiatric/Behavioral: The patient is nervous/anxious.   All other systems reviewed and are negative.     Objective:   Physical Exam Gen: NAD. Vital signs reviewed HENT: Normocephalic, Atraumatic Eyes: EOMI. No discharge.  Cardio: RRR. No JVD. Pulm: B/l clear to auscultation.  Effort normal. Abd: Nondistended, BS+ MSK:  Gait WNL.   No TTP frontal head  Neuro:   Strength  4+/5 b/l UE throughout (pain inhibition, improving) Skin: Warm and Dry. Intact.     Assessment & Plan:  62 y/o female with pmh/psh left ankle fracture,  right knee TKR, right rotator cuff repair, ORIF left radius due to work injury presents for follow up of headaches.   1. Headaches/Migaines - Multifactorial - posterior and anterior, likely different, but overlapping etiologies  Cervicalgia + post concussive syndrome + sleep disturbance  Cont Heat  Patient had massage therapy, with good benefit, but returned to work  Patient received regular eye exams, seen by Neuroopthmo  Cont good water intake  Cont low stim environment, use of sunglasses when in sun, limited  computer, TV use  Cont Lidoderm patch - for frontal headache  C-spine xrays reviewed suggesting degenerative changes C6-7  Patient still with headaches, but doing much more functionally with less medications.   Referral for massage therapy again, encouraged follow up  Will trial Propranolol 10 TID, may increase to 20 TID if no side effects  Will consider Topamax 50qhs (higher doses interfere with work)  2.Myalgia  Trigger point injections with benefit, performed with steroids and repeat with anesthetic x2, with good, but not complete benefit  Cont Robaxin 750 TID PRN   Cont Valium 2mg  TID PRN  3. Sleep disturbance  Unable to tolerate Elavil  Cont Pamelor 10mg  prn qhs due to sedation  4. Anxiety  With panic attacks  Valium 2mg  TID PRN, educated to avoid contaminant use with Robaxin  Improving

## 2017-10-10 ENCOUNTER — Encounter: Payer: Managed Care, Other (non HMO) | Admitting: Physical Medicine & Rehabilitation

## 2018-07-05 IMAGING — CR DG WRIST COMPLETE 3+V*L*
4 series · 4 of 4 positions shown · non-contrast
Comparison: 05/02/2016

CLINICAL DATA: Pain after fall

EXAM:
LEFT WRIST - COMPLETE 3+ VIEW

[x wrist obl left]
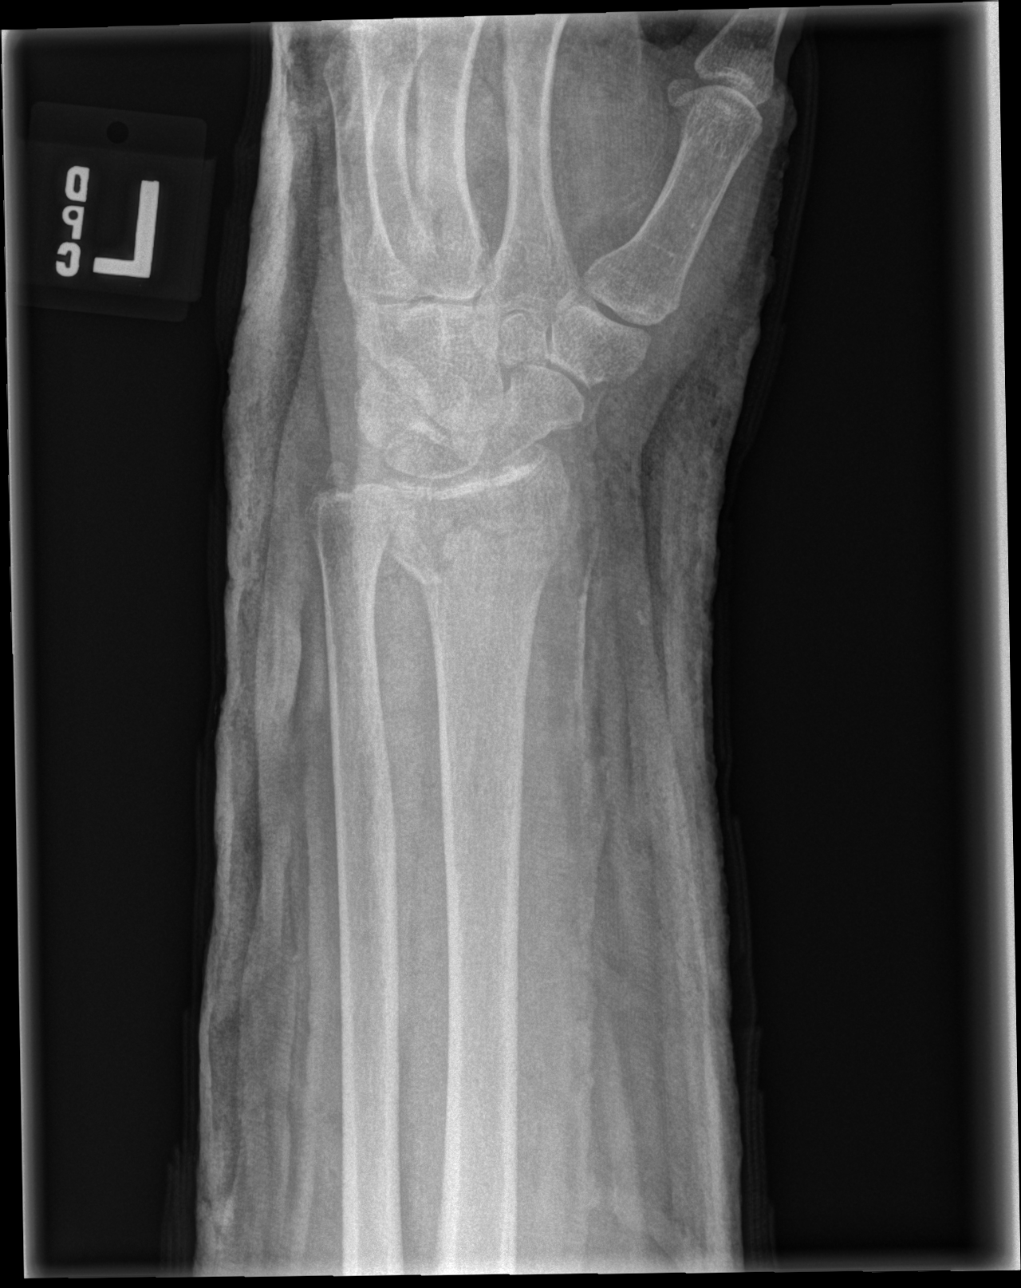

[x wrist lat left]
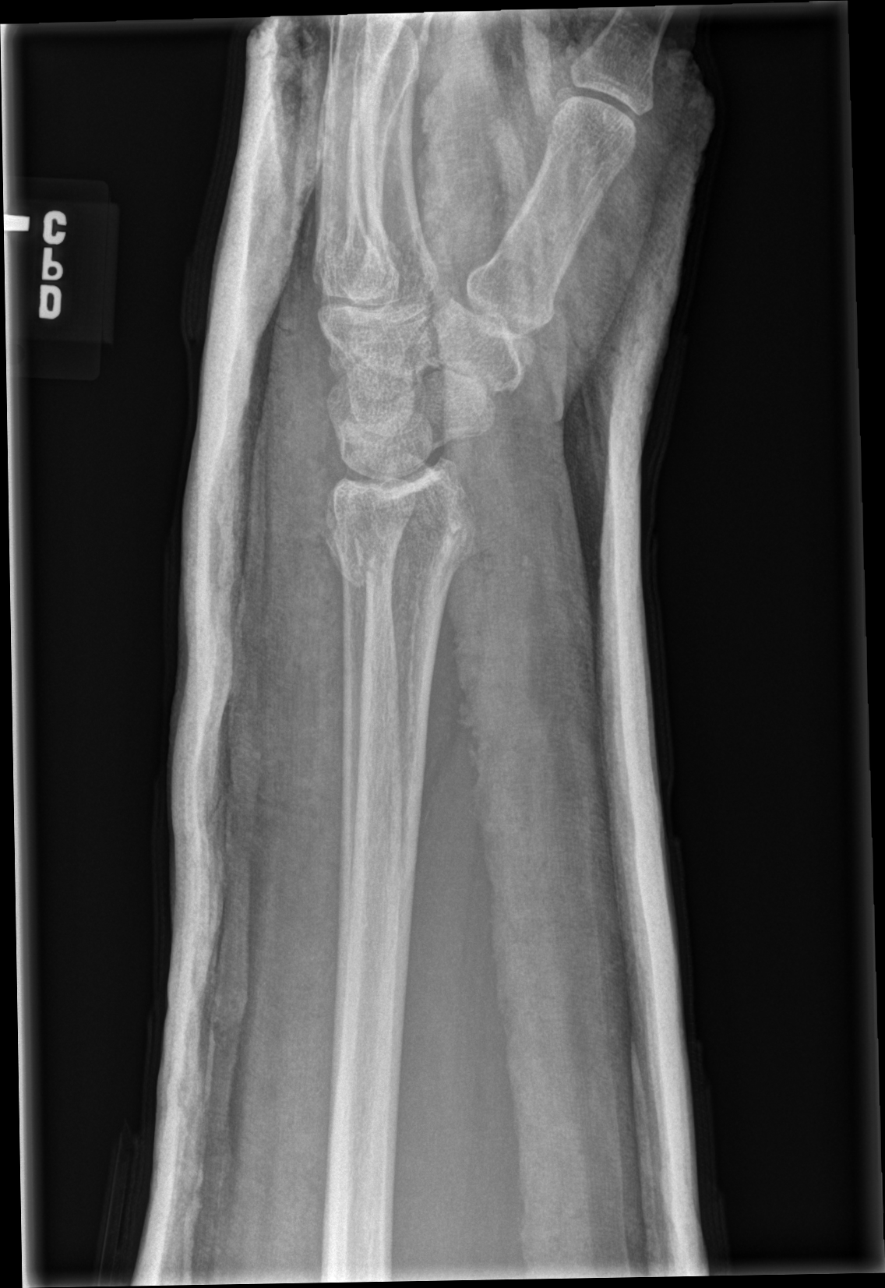

[x wrist pa left]
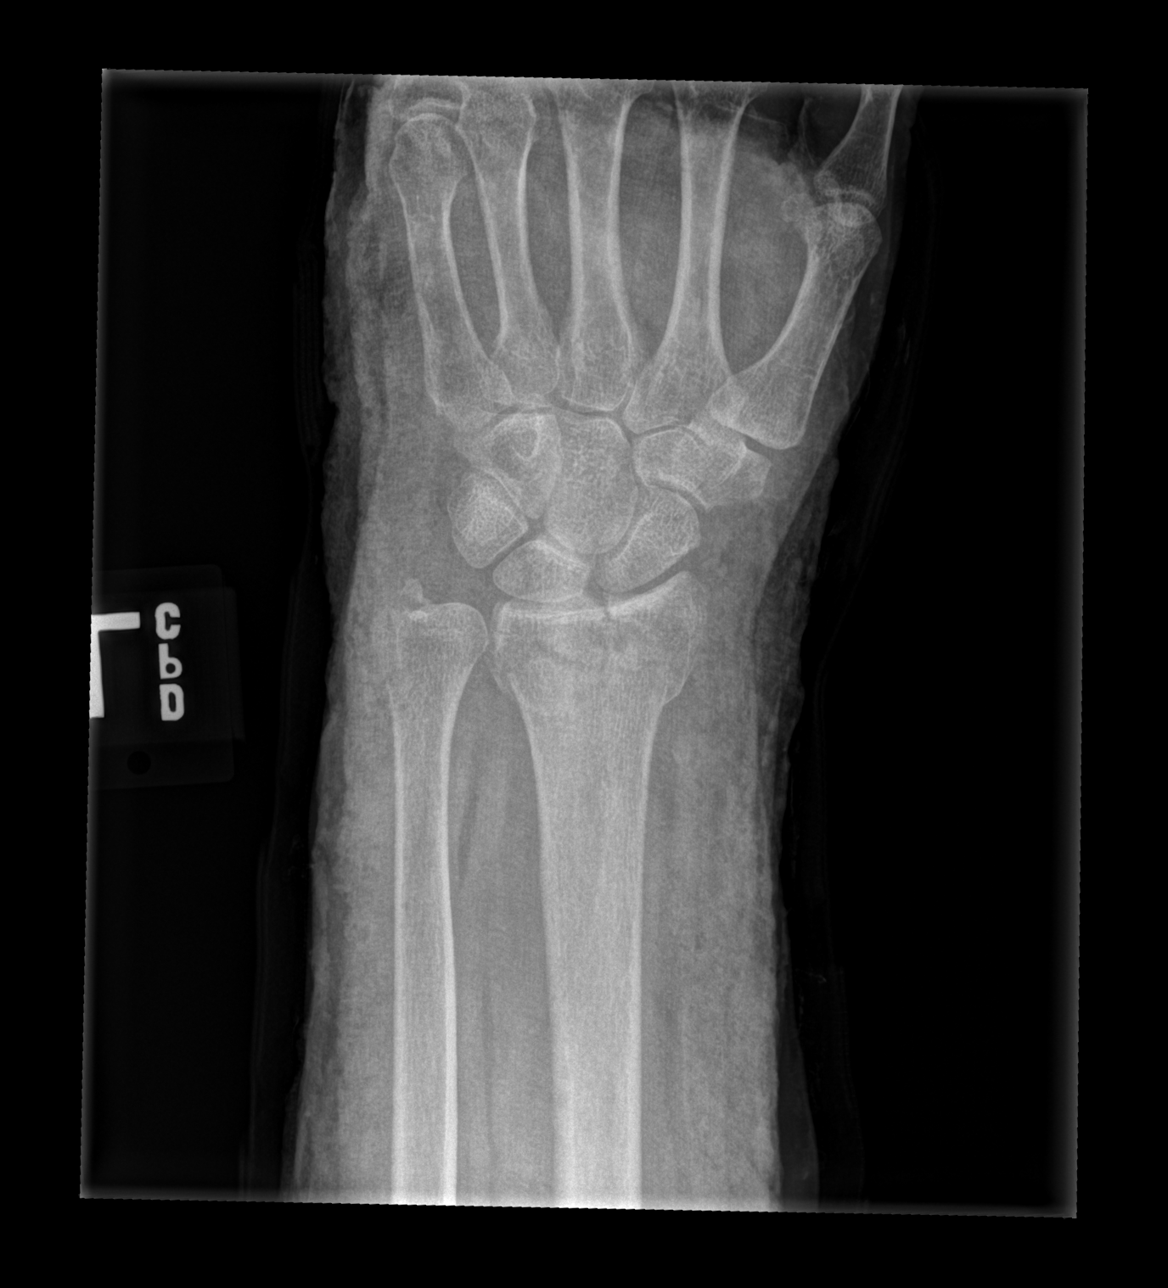

[x wrist navicular view left]
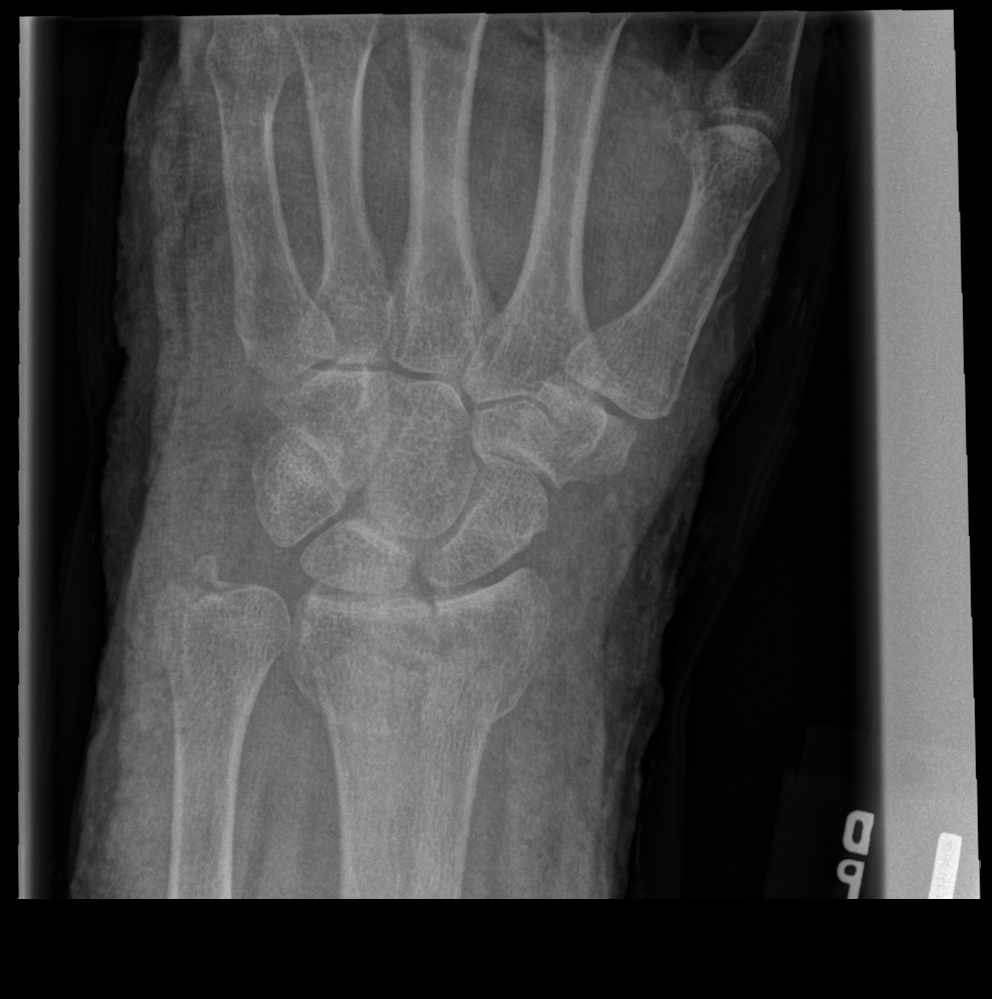

[4 of 4 positions shown; findings below may reference images not displayed]

FINDINGS: Interim casting of the wrist which obscures bone detail. Comminuted
intra-articular distal radius fracture, mildly impacted. Minimal
dorsal angulation, slightly decreased. Comminuted ulnar styloid
process fracture unchanged.
IMPRESSION: Interim casting of the wrist. Comminuted impacted intra-articular
distal radius fracture with slight decreased dorsal angulation.
Comminuted ulnar styloid process fracture.

## 2023-10-04 NOTE — Unmapped External Note (Signed)
" °  [facility]  - PHYSICAL THERAPY [place] [address] [place] [address] [phone] Fax: [phone]   PHYSICIAN SIGNATURE REQUIRED  Thank you for your referral. Please review the following Plan of Care and sign electronically, or fax signed paper copy to: [phone].   Please call Dept: [phone] with any questions or concerns.  Maurine Avelina Hai, PT  Physical Therapy Plan of Care  Initial Evaluation Diagnosis:  1. Bilateral chronic knee pain      2. Meralgia paresthetica, left               Plan and Recommendations:  No formal PT at this time. HEP     Goals: This visit: patient will express good understanding of HEP and self management. MET    Certification: This is to certify that the above named patient, who is under my care, requires skilled Therapy services as described in the above treatment plan. I further certify that the services outlined in this plan are skilled and medically necessary. I have reviewed this plan for rehabilitation services, and I recommend that these services continue to meet the above stated goals and plan.  "

## 2023-10-04 NOTE — Unmapped External Note (Signed)
 General Outpatient Physical Therapy Evaluation  Payor: BLUEMEDICARE / Plan: BLUEMEDICARE / Product Type: Medicare Advantage /   Visit Count: 1  Demographics:  Age: 68 y.o.  Gender: female  Referring Diagnosis: bilateral chronic knee pain Referring Clinician:  Warner Charleston, PA-C   Date of Onset:  chronic  History of Present Illness and Subjective Report: Ms. Breeding had bilateral total knee replacements in 2011. She has been having left thigh pain that is constant and worse with walking. She has been told she has lymphedema. She had gastric bypass surgery in 2017 and has continued to lose weight.   Significant Past Medical History:  No past medical history on file.  Concurrent Services:  None  Therapy within Past Year:  no   Medications:   Current Outpatient Medications:    cholecalciferol (VITAMIN D3) 2,000 unit tablet, Take 1 each (2,000 Units total) by mouth daily.   furosemide (LASIX) 40 mg tablet, Take 1 tablet (40 mg total) by mouth daily.   gabapentin (NEURONTIN) 300 mg capsule, Take 1 capsule (300 mg total) by mouth 2 (two) times a day.   omeprazole (PriLOSEC) 40 mg DR capsule, Take 1 capsule (40 mg total) by mouth in the morning.   semaglutide, weight loss, (Wegovy) 0.25 mg/0.5 mL subcutaneous pen injector, Inject 0.5 mL (0.25 mg total) under the skin every 7 days.   therapeutic multivitamin-minerals (Complete Multivitamin) tab, Take  by mouth. Allergies:   Allergies as of 09/30/2023 - Reviewed 08/21/2023  Allergen Reaction Noted   Penicillins Rash 12/24/2013    Rehabilitation Precautions/Restrictions:   Precautions/Restrictions Precautions: lymphedema L LE; bilateral TKRs (2011)        Fall Risk Screen:  Fall in the last year?: No Feel unsteady or off balance?: No Current Functional Limitations: prolonged walking Patient Stated Goals:  reduce pain Pain:  Pain Assessment Pain Assessment: 0-10 Pain Score  : 8 Home Environment:  N/A  Exercise History:  trying to get to the gym   Work Status: work in HR; sits a lot        OBJECTIVE  General Observation:  arrives in no acute distress - ambulatory without aid - lymphedema L LE  Range of Motion:  Left knee: 0-105 Right knee: 0-100    Strength: MMT R/L LE: 5/5    Sensation:  decreased Left anterolateral thigh    Special Tests: tight quadriceps L>R   Outcomes:  PT Outcome Measures    Flowsheet Row Value  Lower Extremity Functional Scale (LEFS)   Any of your usual work, housework, or school activities 2  Your usual hobbies, recreational, or sporting activities 0  Getting into or out of the bath 2  Walking between rooms 3  Putting on your shoes or socks 3  Squatting 2  Lifting an object, like a bag of groceries from the floor 4  Performing light activities around your home 4  Performing heavy activities around your home 3  Getting into or out of car 4  Walking 2 blocks 2  Walking a mile 2  Going up or down 10 stairs (about 1 flight of stairs) 3  Standing for 1 hour 3  Sitting for 1 hour 4  Running on even ground 2  Running on uneven ground 2  Making sharp turns while running fast 0  Hopping 1  Rolling over in bed 3  LEFS Raw Score 49 /80  Lower Extremity Functional Scale (LEFS) Score 61.25 /100  LE Functional Disability Score 38.75     Interventions:  Therapeutic Exercise:  Taught and performed HEP including: quad stretching, bridging, lumbar extension in standing (when she gets up from sitting)  Education: provided as follows: Barriers to Learning:  No Barriers Patient was educated in illness/disease, diagnosis and plan of care, rehab techniques and procedures, and applicable precautions. Audience:  Patient Explanation, demonstration and written material was provided. Patient applied knowledge, demonstrated skill and expressed understanding.    ASSESSMENT 68 year old female presents with chief complaint of left anterolateral thigh pain. Symptoms  consistent with meralgia paresthetica. She has a history of bilateral knee replacements. We discussed the etiology, prognosis and treatment for this. She was advised that continued weight loss will help. Instructed in HEP.   Therapy Diagnosis:     ICD-10-CM   1. Bilateral chronic knee pain  M25.561, M25.562, G89.29   2. Meralgia paresthetica, left  G57.12    Problem List:   pain, decreased activity tolerance  Equipment Needed:  None  Other Rehabilitation Considerations:  None  Response to Evaluation:  Patient tolerated the evaluation well without any increase in pain or symptoms and had no unanswered questions.   Rehabilitation Potential:    Motivation/Commitment to Therapy:  Not Applicable  Rehabilitation Potential:  Not Applicable  Support Structure:  Not applicable  Goals:  This visit: patient will express good understanding of HEP and self management. MET    PLAN Treatment Frequency and Duration: No formal PT at this time. HEP   Necessity: N/A  Recommended Consults:  None currently  Development of Plan of Care:  Patient participated in plan of care development.  Total Treatment Time (Time & Untimed): Total Treatment Time: 33 Total Time in Timed Codes: Time in Timed Codes: 8 Eval/Re-Eval Charges PT Evaluation Low Complexity 20 minutes: 25   Treatment/Procedures Therapeutic Exercises minutes: 8         PT Eval Complexity: History of Comorbidities/Personal Care Impacting Plan of Care: 3-4 personal factors and/or comorbidities Examination of Body Systems: 1-2 elements Presentation of Characteristics: Stable and/or uncomplicated characteristics PT Eval Complexity Score: Low Complexity     The patient has been instructed to contact our clinic if any questions or problems should arise.

## 2024-02-06 NOTE — Progress Notes (Signed)
 " Sara Mendez presents to primary care clinic for ongoing management of Hypertension .  Last CPE:07/29/23  SUBJECTIVE   Hypertension Sara Mendez presents for hypertension follow up.  Currently on Lasix 40 mg daily Takes meds daily as rx. Does not check home BP. Denies chest pain, SOB, DOE, orthopnea, - edema BLE, more in LLE BP Readings from Last 3 Encounters:  02/06/24 98/67  01/02/24 121/68  10/30/23 116/75   Leg Pain  Sara Mendez states leg pain/swelling still present in left leg She feels sxs are getting worse Taking Gabapentin 300 mg nightly and Tylenol  PRN MRI 02/02/24 - Will review results today   Allergies[1]  Current Medications[2]  The following portions of the patient's history were reviewed and updated as appropriate: allergies, current medications, past medical history, past social history, past family history, and problem list.   ROS   See HPI.  OBJECTIVE  BP 98/67 (BP Location: Left arm, Patient Position: Sitting)   Pulse 62   Temp 98.4 F (36.9 C)   Resp 16   Ht 1.651 m (5' 5)   Wt 106 kg (233 lb)   SpO2 100%   BMI 38.77 kg/m   GENERAL APPEARANCE: Well appearing, well developed, no acute distress. LUNGS: Clear to auscultation bilaterally HEART: Regular rate and rhythm without murmur. ABDOMEN: Normal bowel sounds, soft, non-tender, without hepato-splenomegaly EXTREMITIES: No edema NEUROLOGIC: Alert and oriented x3, no obvious deficits.  Physical Exam Musculoskeletal:     Right lower leg: 1+ Edema present.     Left lower leg: 2+ Edema present.     ASSESSMENT/PLAN  1. Sciatic nerve pain, unspecified laterality - gabapentin (NEURONTIN) 100 mg capsule; Take 2 capsules (200 mg total) by mouth 2 (two) times a day as needed (pain).  Dispense: 60 capsule; Refill: 2 - predniSONE  (DELTASONE ) 10 mg tablet; Take 6 tablets PO day 1, 5 tablets day 2, 4 tablets day 3, 3 tablets day 4, 2 tablets day 5, 1 tablet day 6  Dispense: 21 tablet; Refill: 0 Pt to take  Gabapentin 200mg  BID for pain and 300mg  at bedtime.   2. Left leg pain (Primary) Take medication as prescribed.   3. Lymphedema Compression therapy to LLE as tolerated. Pt does not have compression at this time. Will look into thigh high compression. In the mean time will try compression biker shorts and/or ace bandage 12 hours on and 12 hours off. Elevate. Rest.    Return in about 3 months (around 05/07/2024) for Recheck.  This document serves as a record of services personally performed by Sheppard Ee, FNP.  It was created on their behalf by Inocente Fabian, CMA, a trained medical scribe, and Certified Medical Assistant (CMA). During the course of documenting the history, physical exam and medical decision making, I was functioning as a stage manager. The creation of this record is the providers dictation and/or activities during the visit.  Electronically signed by Inocente Fabian, CMA 02/06/2024 9:22 PM     I agree the documentation is accurate and complete.  Electronically signed by: Vergil Marko Ee, FNP 02/06/2024 9:23 PM        [1] Allergies Allergen Reactions   Penicillins Rash  [2]  Current Outpatient Medications:    cholecalciferol (VITAMIN D3) 2,000 unit tablet, Take 1 each (2,000 Units total) by mouth daily., Disp: 90 each, Rfl: 3   furosemide (LASIX) 40 mg tablet, TAKE 1 TABLET(40 MG) BY MOUTH DAILY, Disp: 90 tablet, Rfl: 1   gabapentin (NEURONTIN) 300 mg capsule, Take 1 capsule (  300 mg total) by mouth 2 (two) times a day., Disp: 60 capsule, Rfl: 1   omeprazole (PriLOSEC) 40 mg DR capsule, TAKE 1 CAPSULE(40 MG) BY MOUTH IN THE MORNING, Disp: 90 capsule, Rfl: 1   therapeutic multivitamin-minerals (Complete Multivitamin) tab, Take  by mouth., Disp: , Rfl:    gabapentin (NEURONTIN) 100 mg capsule, Take 2 capsules (200 mg total) by mouth 2 (two) times a day as needed (pain)., Disp: 60 capsule, Rfl: 2   predniSONE  (DELTASONE ) 10 mg tablet, Take 6 tablets PO  day 1, 5 tablets day 2, 4 tablets day 3, 3 tablets day 4, 2 tablets day 5, 1 tablet day 6, Disp: 21 tablet, Rfl: 0 No current facility-administered medications for this visit.  Facility-Administered Medications Ordered in Other Visits:    regadenoson (LEXISCAN) injection 0.4 mg, 0.4 mg, intravenous, Once PRN, Debby Isla Manner, MD "

## 2024-06-30 ENCOUNTER — Ambulatory Visit: Admitting: Occupational Therapy
